# Patient Record
Sex: Male | Born: 1970 | Race: White | Hispanic: No | Marital: Married | Smoking: Never smoker
Health system: Southern US, Community
[De-identification: ages and names within clinical notes are randomized; demographics above are authoritative.]

## PROBLEM LIST (undated history)

## (undated) DIAGNOSIS — E291 Testicular hypofunction: Secondary | ICD-10-CM

## (undated) DIAGNOSIS — I422 Other hypertrophic cardiomyopathy: Secondary | ICD-10-CM

## (undated) DIAGNOSIS — I10 Essential (primary) hypertension: Secondary | ICD-10-CM

## (undated) DIAGNOSIS — E785 Hyperlipidemia, unspecified: Secondary | ICD-10-CM

## (undated) HISTORY — DX: Essential (primary) hypertension: I10

## (undated) HISTORY — PX: APPENDECTOMY: SHX54

## (undated) HISTORY — PX: TONSILLECTOMY: SUR1361

## (undated) HISTORY — DX: Other hypertrophic cardiomyopathy: I42.2

## (undated) HISTORY — DX: Hyperlipidemia, unspecified: E78.5

## (undated) HISTORY — DX: Testicular hypofunction: E29.1

## (undated) HISTORY — PX: HERNIA REPAIR: SHX51

---

## 2005-11-11 ENCOUNTER — Ambulatory Visit: Payer: Self-pay | Admitting: General Surgery

## 2008-01-25 ENCOUNTER — Ambulatory Visit: Payer: Self-pay | Admitting: Cardiology

## 2008-02-11 ENCOUNTER — Ambulatory Visit: Payer: Self-pay | Admitting: Internal Medicine

## 2008-02-11 ENCOUNTER — Encounter: Payer: Self-pay | Admitting: Cardiology

## 2008-02-11 LAB — CONVERTED CEMR LAB
BUN: 14 mg/dL (ref 6–23)
Calcium: 9.8 mg/dL (ref 8.4–10.5)
Glucose, Bld: 59 mg/dL — ABNORMAL LOW (ref 70–99)

## 2008-02-14 ENCOUNTER — Ambulatory Visit (HOSPITAL_COMMUNITY): Admission: RE | Admit: 2008-02-14 | Discharge: 2008-02-14 | Payer: Self-pay | Admitting: Cardiology

## 2008-02-25 ENCOUNTER — Ambulatory Visit: Payer: Self-pay | Admitting: Cardiology

## 2008-02-26 ENCOUNTER — Encounter: Payer: Self-pay | Admitting: Cardiology

## 2008-02-27 ENCOUNTER — Ambulatory Visit: Payer: Self-pay | Admitting: Cardiology

## 2008-12-31 DIAGNOSIS — I429 Cardiomyopathy, unspecified: Secondary | ICD-10-CM

## 2008-12-31 DIAGNOSIS — I10 Essential (primary) hypertension: Secondary | ICD-10-CM | POA: Insufficient documentation

## 2009-03-03 ENCOUNTER — Ambulatory Visit: Payer: Self-pay

## 2009-03-03 ENCOUNTER — Encounter: Payer: Self-pay | Admitting: Cardiology

## 2009-03-12 ENCOUNTER — Ambulatory Visit: Payer: Self-pay | Admitting: Cardiology

## 2009-03-12 DIAGNOSIS — I422 Other hypertrophic cardiomyopathy: Secondary | ICD-10-CM | POA: Insufficient documentation

## 2009-03-12 DIAGNOSIS — E785 Hyperlipidemia, unspecified: Secondary | ICD-10-CM | POA: Insufficient documentation

## 2009-04-01 ENCOUNTER — Telehealth: Payer: Self-pay | Admitting: Cardiology

## 2009-04-14 ENCOUNTER — Ambulatory Visit: Payer: Self-pay | Admitting: Cardiology

## 2009-04-15 LAB — CONVERTED CEMR LAB
ALT: 19 units/L (ref 0–53)
Alkaline Phosphatase: 60 units/L (ref 39–117)
Creatinine, Ser: 0.86 mg/dL (ref 0.40–1.50)
Glucose, Bld: 85 mg/dL (ref 70–99)
LDL Cholesterol: 117 mg/dL — ABNORMAL HIGH (ref 0–99)
Sodium: 139 meq/L (ref 135–145)
Total Bilirubin: 0.7 mg/dL (ref 0.3–1.2)
Total CHOL/HDL Ratio: 4.1
Total Protein: 7.4 g/dL (ref 6.0–8.3)
Triglycerides: 91 mg/dL (ref <150)
VLDL: 18 mg/dL (ref 0–40)

## 2009-05-19 ENCOUNTER — Telehealth: Payer: Self-pay | Admitting: Cardiology

## 2009-08-17 ENCOUNTER — Ambulatory Visit: Payer: Self-pay | Admitting: Family Medicine

## 2009-08-17 DIAGNOSIS — B351 Tinea unguium: Secondary | ICD-10-CM

## 2009-08-17 HISTORY — DX: Tinea unguium: B35.1

## 2009-08-17 LAB — CONVERTED CEMR LAB
ALT: 22 units/L (ref 0–53)
AST: 43 units/L — ABNORMAL HIGH (ref 0–37)
Albumin: 4.5 g/dL (ref 3.5–5.2)
Alkaline Phosphatase: 67 units/L (ref 39–117)
Bilirubin, Direct: 0.2 mg/dL (ref 0.0–0.3)
CO2: 27 meq/L (ref 19–32)
Calcium: 9.6 mg/dL (ref 8.4–10.5)
Chloride: 107 meq/L (ref 96–112)
Glucose, Bld: 90 mg/dL (ref 70–99)
HDL: 39.8 mg/dL (ref >39.00)
Sodium: 143 meq/L (ref 135–145)
Total Protein: 7.3 g/dL (ref 6.0–8.3)

## 2009-08-26 ENCOUNTER — Telehealth: Payer: Self-pay | Admitting: Family Medicine

## 2009-10-05 ENCOUNTER — Ambulatory Visit: Payer: Self-pay | Admitting: Family Medicine

## 2009-10-05 LAB — CONVERTED CEMR LAB
Albumin: 4.4 g/dL (ref 3.5–5.2)
Total Protein: 7.2 g/dL (ref 6.0–8.3)

## 2009-11-20 ENCOUNTER — Ambulatory Visit: Payer: Self-pay | Admitting: Family Medicine

## 2009-11-23 LAB — CONVERTED CEMR LAB
AST: 55 units/L — ABNORMAL HIGH (ref 0–37)
Alkaline Phosphatase: 62 units/L (ref 39–117)
Bilirubin, Direct: 0.1 mg/dL (ref 0.0–0.3)
Total Bilirubin: 0.7 mg/dL (ref 0.3–1.2)

## 2009-12-23 ENCOUNTER — Telehealth: Payer: Self-pay | Admitting: Family Medicine

## 2010-01-04 ENCOUNTER — Ambulatory Visit: Payer: Self-pay | Admitting: Family Medicine

## 2010-01-04 LAB — CONVERTED CEMR LAB
ALT: 26 units/L (ref 0–53)
AST: 45 units/L — ABNORMAL HIGH (ref 0–37)
Albumin: 4.2 g/dL (ref 3.5–5.2)
Alkaline Phosphatase: 62 units/L (ref 39–117)

## 2010-02-22 ENCOUNTER — Encounter: Payer: Self-pay | Admitting: Cardiology

## 2010-02-22 ENCOUNTER — Ambulatory Visit: Payer: Self-pay | Admitting: Cardiology

## 2010-04-13 NOTE — Progress Notes (Signed)
Summary: refill request for grifulvin  Phone Note Refill Request Message from:  Fax from Pharmacy  Refills Requested: Medication #1:  GRIFULVIN V 500 MG TABS 1 tab by mouth daily x 6 weeks. Dispense qs.   Last Refilled: 11/21/2009 Faxed request from [facility] [address], [phone]  Initial call taken by: Lowella Petties CMA,  December 23, 2009 3:30 PM    Prescriptions: GRIFULVIN V 500 MG TABS (GRISEOFULVIN MICROSIZE) 1 tab by mouth daily x 6 weeks. Dispense qs  #1 x 0   Entered and Authorized by:   Ruthe Mannan MD   Signed by:   Ruthe Mannan MD on 12/24/2009   Method used:   Electronically to        [facility] [address] (retail)       [address]       [place], [place]  [address]       Ph: [phone]       Fax: [phone]   RxID:   [id number]

## 2010-04-13 NOTE — Assessment & Plan Note (Signed)
Summary: FUNGUS IN NAIL / LFW   Vital Signs:  Patient profile:   40 year old male Height:      71.50 inches Weight:      216 pounds BMI:     29.81 Temp:     98.3 degrees F oral Pulse rate:   60 / minute Pulse rhythm:   regular BP sitting:   104 / 72  (left arm) Cuff size:   regular  Vitals Entered By: Linde Gillis CMA Duncan Dull) (November 20, 2009 11:56 AM) CC: nail fungus   History of Present Illness: 40 yo here for follow up of nail fungust.  White flakes on finger nails- has been there for months, now nails look like they are growing differently as well. Has tried OTC lamisil with no relief of symptoms.  We started Terbinafine 250 mg 1 tab by mouth daily in June.  Feels his nails are better, but still has two finger nails that look the same.  Denies any known side effects or adverse reactions to the terbinafine.  no abdominal pain, nausea, vomiting or changes in his stools.  LFTs have been stable.    Current Medications (verified): 1)  Ramipril 2.5 Mg Caps (Ramipril) .... Take One Capsule By Mouth Daily 2)  Metoprolol Succinate 50 Mg Xr24h-Tab (Metoprolol Succinate) .... Take One Tablet By Mouth Daily 3)  Terbinafine Hcl 250 Mg Tabs (Terbinafine Hcl) .Marland Kitchen.. 1 Tab Po Daily For Up To 6 Weeks 4)  Itraconazole 100 Mg Caps (Itraconazole) .... 2 Tab By Mouth Daily X 12 Weeks. Dispense Qs  Allergies (verified): No Known Drug Allergies  Past History:  Past Medical History: Last updated: 08/17/2009 1. Stage I hypertension. 2. Borderline hypogonadism.   3. History of elevated triglycerides.  The patient was on Lovaza on the past for this; however, he no longer takes it, and his most recent triglycerides were excellent at 87. 4. Hypertrophic cardiomyopathy.  Cardiac MRI was done December 2009, showing EF of 66%.  There is focal basal septal hypertrophy.  The maximum thickness at the basal septum is 2.2 cm.  There is no systolic anterior motion of the mitral valve, so an LV outflow  tract gradient is unlikely.  There was no myocardial delayed enhancement.  Holter monitor was also done in November 2009 and was essentially normal with no PVCs or runs of NSVT.  ETT showed good exercise tolerance and normal blood pressure response.  Echo (12/10) showed severe focal basal septal asymmetric hypertrophy.  No SAM or LV outflow tract gradient.  EF 55-60%.  Mild MR.  Parents and children have been screened for HCM by echo with no suggestive findings.     Past Surgical History: Last updated: 12/31/2008 Appendectomy Tonsillectomy hernia   Family History: Last updated: 03/12/2009 The patient states that his grandmother died at age 55 from congestive heart failure and diabetes.  His father has hypertension.  He had 1 grandfather who had a heart attack before the age of 23.  His other grandfather had what sounds like an episode of sudden cardiac death before the age of 39.   Social History: Last updated: 03/12/2009 The patient is married.  He has 2 children.  He is originally from [place].  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  He does not smoke or use any illicit drugs.   Risk Factors: Exercise: yes (12/31/2008)  Risk Factors: Smoking Status: never (12/31/2008)  Review of Systems  See HPI General:  Denies malaise. GI:  Denies abdominal pain, bloody stools, and change in bowel habits.  Physical Exam  General:  Well developed, well nourished, in no acute distress. Extremities:  No clubbing, cyanosis, edema, or deformity noted with normal full range of motion of all joints.   Does have nail changes consistent with fungal infection but improved. Psych:  Normal affect.   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 Will stop Terbinafine and Try Itraconazole.  Recheck LFTs again today. Follow up in 6 weeks. The following medications were  removed from the medication list:    Terbinafine Hcl 250 Mg Tabs (Terbinafine hcl) .Marland Kitchen... 1 tab po daily for up to 6 weeks His updated medication list for this problem includes:    Itraconazole 100 Mg Caps (Itraconazole) .Marland Kitchen... 2 tab by mouth daily x 12 weeks. dispense qs  Orders: Venipuncture (62130) TLB-Hepatic/Liver Function Pnl (80076-HEPATIC)  Complete Medication List: 1)  Ramipril 2.5 Mg Caps (Ramipril) .... Take one capsule by mouth daily 2)  Metoprolol Succinate 50 Mg Xr24h-tab (Metoprolol succinate) .... Take one tablet by mouth daily 3)  Itraconazole 100 Mg Caps (Itraconazole) .... 2 tab by mouth daily x 12 weeks. dispense qs Prescriptions: ITRACONAZOLE 100 MG CAPS (ITRACONAZOLE) 2 tab by mouth daily x 12 weeks. dispense qs  #1 x 0   Entered and Authorized by:   Ruthe Mannan MD   Signed by:   Ruthe Mannan MD on 11/20/2009   Method used:   Electronically to        [facility] [address] (retail)       [address]       [place], [place]  [address]       Ph: [phone]       Fax: [phone]   RxID:   [id number]   Current Allergies (reviewed today): No known allergies   Appended Document: FUNGUS IN NAIL / LFW Rx for Itrancozole cancelled at [facility].  Dr. Dayton Martes sent in correct Rx of Grifulvin electronically.

## 2010-04-13 NOTE — Letter (Signed)
Summary: Records Dated 10-07-05 thru 09-05-08/[facility]  Records Dated 10-07-05 thru 09-05-08/[facility]   Imported By: Lanelle Bal 08/24/2009 08:47:55  _____________________________________________________________________  External Attachment:    Type:   Image     Comment:   External Document

## 2010-04-13 NOTE — Assessment & Plan Note (Signed)
Summary: 6 week follow up per Dr. Aron/nt   Vital Signs:  Patient profile:   40 year old male Height:      71.50 inches Weight:      215.13 pounds BMI:     29.69 Temp:     97.8 degrees F oral Pulse rate:   60 / minute Pulse rhythm:   regular BP sitting:   130 / 80  (left arm) Cuff size:   regular  Vitals Entered By: Linde Gillis CMA Duncan Dull) (October 05, 2009 8:09 AM) CC: blood test to check liver function   History of Present Illness: 40 yo here for six week follow up.    White flakes on finger nails- has been there for months, now nails look like they are growing differently as well. Has tried OTC lamisil with no relief of symptoms.  We started Terbinafine 250 mg 1 tab by mouth daily 6 weeks ago.  Feels his nails are better, but still has a few flakes.    Denies any known side effects or adverse reactions to the terbinafine.  no abdominal pain, nausea, vomiting or changes in his stools.    Current Medications (verified): 1)  Ramipril 2.5 Mg Caps (Ramipril) .... Take One Capsule By Mouth Daily 2)  Metoprolol Succinate 50 Mg Xr24h-Tab (Metoprolol Succinate) .... Take One Tablet By Mouth Daily 3)  Terbinafine Hcl 250 Mg Tabs (Terbinafine Hcl) .Marland Kitchen.. 1 Tab Po Daily For Up To 6 Weeks  Allergies (verified): No Known Drug Allergies  Past History:  Past Medical History: Last updated: 08/17/2009 1. Stage I hypertension. 2. Borderline hypogonadism.   3. History of elevated triglycerides.  The patient was on Lovaza on the past for this; however, he no longer takes it, and his most recent triglycerides were excellent at 87. 4. Hypertrophic cardiomyopathy.  Cardiac MRI was done December 2009, showing EF of 66%.  There is focal basal septal hypertrophy.  The maximum thickness at the basal septum is 2.2 cm.  There is no systolic anterior motion of the mitral valve, so an LV outflow tract gradient is unlikely.  There was no myocardial delayed enhancement.  Holter monitor was also done in  November 2009 and was essentially normal with no PVCs or runs of NSVT.  ETT showed good exercise tolerance and normal blood pressure response.  Echo (12/10) showed severe focal basal septal asymmetric hypertrophy.  No SAM or LV outflow tract gradient.  EF 55-60%.  Mild MR.  Parents and children have been screened for HCM by echo with no suggestive findings.     Past Surgical History: Last updated: 12/31/2008 Appendectomy Tonsillectomy hernia   Family History: Last updated: 03/12/2009 The patient states that his grandmother died at age 39 from congestive heart failure and diabetes.  His father has hypertension.  He had 1 grandfather who had a heart attack before the age of 82.  His other grandfather had what sounds like an episode of sudden cardiac death before the age of 19.   Social History: Last updated: 03/12/2009 The patient is married.  He has 2 children.  He is originally from [place].  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  He does not smoke or use any illicit drugs.   Risk Factors: Exercise: yes (12/31/2008)  Risk Factors: Smoking Status: never (12/31/2008)  Review of Systems      See HPI General:  Denies malaise. GI:  Denies abdominal pain, change in bowel habits, nausea, vomiting,  and yellowish skin color.  Physical Exam  General:  Well developed, well nourished, in no acute distress. Extremities:  No clubbing, cyanosis, edema, or deformity noted with normal full range of motion of all joints.   Does have nail changes consistent with fungal infection but improved. Psych:  Normal affect.   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 Follow up again in 6 weeks. His updated medication list for this problem includes:    Terbinafine Hcl 250 Mg Tabs (Terbinafine hcl) .Marland Kitchen... 1 tab po daily for  up to 6 weeks  Orders: TLB-Hepatic/Liver Function Pnl (80076-HEPATIC) Venipuncture (08657)  Complete Medication List: 1)  Ramipril 2.5 Mg Caps (Ramipril) .... Take one capsule by mouth daily 2)  Metoprolol Succinate 50 Mg Xr24h-tab (Metoprolol succinate) .... Take one tablet by mouth daily 3)  Terbinafine Hcl 250 Mg Tabs (Terbinafine hcl) .Marland Kitchen.. 1 tab po daily for up to 6 weeks  Current Allergies (reviewed today): No known allergies

## 2010-04-13 NOTE — Assessment & Plan Note (Signed)
Summary: NEW PT TO EST/CPX/CLE   Vital Signs:  Patient profile:   40 year old male Height:      71.50 inches Weight:      217.38 pounds BMI:     30.00 Temp:     98.2 degrees F oral Pulse rate:   48 / minute Pulse rhythm:   regular BP sitting:   116 / 80  (right arm) Cuff size:   regular  Vitals Entered By: Linde Gillis CMA Duncan Dull) (August 17, 2009 9:42 AM) CC: new patient   History of Present Illness: 40 yo here to establish care.  Sees Dr. Shirlee Latch for hypertrophic cardiomyopathy- doing well.  Has no CP at rest or exertion.  Never had any dizziness or episodes of syncope.   At recent office visit, Metoprolol was increased to 50 mg daily.  He is doing well with new dose.  HTN- has always had mildly elevated bp, particularly diastolic.  Taking Ramipirl 2.5 mg in addition to his metoprolol.  Has a strong FH of HTN and CAD.  White flakes on finger nails- has been there for months, now nails look like they are growing differently as well. Has tried OTC lamisil with no relief of symptoms.  Well man- he and his wife do not believe in immunizations for themselves or their children.   No religious beliefs that will interfere with medical care.  Current Medications (verified): 1)  Ramipril 2.5 Mg Caps (Ramipril) .... Take One Capsule By Mouth Daily 2)  Metoprolol Succinate 50 Mg Xr24h-Tab (Metoprolol Succinate) .... Take One Tablet By Mouth Daily  Allergies (verified): No Known Drug Allergies  Past History:  Past Surgical History: Last updated: 12/31/2008 Appendectomy Tonsillectomy hernia   Family History: Last updated: 03/12/2009 The patient states that his grandmother died at age 65 from congestive heart failure and diabetes.  His father has hypertension.  He had 1 grandfather who had a heart attack before the age of 42.  His other grandfather had what sounds like an episode of sudden cardiac death before the age of 40.   Social History: Last updated: 03/12/2009 The patient  is married.  He has 2 children.  He is originally from [place].  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  He does not smoke or use any illicit drugs.   Risk Factors: Exercise: yes (12/31/2008)  Risk Factors: Smoking Status: never (12/31/2008)  Past Medical History: 1. Stage I hypertension. 2. Borderline hypogonadism.   3. History of elevated triglycerides.  The patient was on Lovaza on the past for this; however, he no longer takes it, and his most recent triglycerides were excellent at 87. 4. Hypertrophic cardiomyopathy.  Cardiac MRI was done December 2009, showing EF of 66%.  There is focal basal septal hypertrophy.  The maximum thickness at the basal septum is 2.2 cm.  There is no systolic anterior motion of the mitral valve, so an LV outflow tract gradient is unlikely.  There was no myocardial delayed enhancement.  Holter monitor was also done in November 2009 and was essentially normal with no PVCs or runs of NSVT.  ETT showed good exercise tolerance and normal blood pressure response.  Echo (12/10) showed severe focal basal septal asymmetric hypertrophy.  No SAM or LV outflow tract gradient.  EF 55-60%.  Mild MR.  Parents and children have been screened for HCM by echo with no suggestive findings.     Review of Systems  See HPI General:  Denies fever and malaise. Eyes:  Denies blurring. ENT:  Denies difficulty swallowing. CV:  Denies chest pain or discomfort. Resp:  Denies shortness of breath. GI:  Denies abdominal pain, bloody stools, and change in bowel habits. GU:  Denies decreased libido and erectile dysfunction. MS:  Denies joint pain, joint redness, and joint swelling. Derm:  Denies rash. Neuro:  Denies headaches. Psych:  Denies anxiety and depression. Endo:  Denies cold intolerance and heat intolerance. Heme:  Denies abnormal bruising and bleeding.  Physical Exam  General:  Well developed, well nourished, in no acute  distress. Head:  normocephalic and atraumatic.   Eyes:  vision grossly intact and pupils equal.   Ears:  R ear normal and L ear normal.   Nose:  no external deformity.   Mouth:  Oral mucosa and oropharynx without lesions or exudates.  Teeth in good repair. Lungs:  Clear bilaterally to auscultation and percussion. Heart:  Non-displaced PMI, chest non-tender; regular rate and rhythm, S1, S2 without murmurs, rubs or gallops. Carotid upstroke normal, no bruit.  Pedals normal pulses. No edema, no varicosities. Abdomen:  Bowel sounds positive,abdomen soft and non-tender without masses, organomegaly or hernias noted. Extremities:  No clubbing, cyanosis, edema, or deformity noted with normal full range of motion of all joints.   Does have nail changes consistent with fungal infection. Skin:  turgor normal and color normal.   Psych:  Normal affect.   Impression & Recommendations:  Problem # 1:  CARDIOMYOPATHY, HYPERTROPHIC (ICD-425.1) Assessment Unchanged Followed by Dr. Shirlee Latch.  Doing well.    Problem # 2:  HYPERLIPIDEMIA-MIXED (ICD-272.4) Assessment: Unchanged Has not had lipid panel checked in several months.  Will recheck today. 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 Check LFTs, if normal, start oral meds.  Problem # 4:  HYPERTENSION, UNSPECIFIED (ICD-401.9) Assessment: Unchanged Stable.  Continue current meds. His updated medication list for this problem includes:    Ramipril 2.5 Mg Caps (Ramipril) .Marland Kitchen... Take one capsule by mouth daily    Metoprolol Succinate 50 Mg Xr24h-tab (Metoprolol succinate) .Marland Kitchen... Take one tablet by mouth daily  Orders: Venipuncture (78295) TLB-BMP (Basic Metabolic Panel-BMET) (80048-METABOL)  Complete Medication List: 1)  Ramipril 2.5 Mg Caps (Ramipril) .... Take one capsule by mouth daily 2)  Metoprolol  Succinate 50 Mg Xr24h-tab (Metoprolol succinate) .... Take one tablet by mouth daily  Patient Instructions: 1)  Nice to meet you, Tony Murphy. 2)  I will be in touch with your lab work in the next couple of days.  Current Allergies (reviewed today): No known allergies

## 2010-04-13 NOTE — Progress Notes (Signed)
Summary: RX   Phone Note Refill Request Call back at Home Phone [phone] Message from:  Patient on May 19, 2009 8:12 AM  Refills Requested: Medication #1:  RAMIPRIL 2.5 MG CAPS Take one capsule by mouth daily [facility]  Initial call taken by: Harlon Flor,  May 19, 2009 8:12 AM    Prescriptions: RAMIPRIL 2.5 MG CAPS (RAMIPRIL) Take one capsule by mouth daily  #90 x 3   Entered by:   Mercer Pod   Authorized by:   Marca Ancona, MD   Signed by:   Mercer Pod on 05/19/2009   Method used:   Electronically to        [facility] (mail-order)             ,          Ph: [phone]       Fax: [phone]   RxID:   [id number]

## 2010-04-13 NOTE — Progress Notes (Signed)
Summary: wants medicine for nail fungus  Phone Note Call from Patient Call back at [phone]   Caller: Patient Call For: Ruthe Mannan MD Summary of Call: Pt states that when he was here for his first visit he had talked with you about getting an oral medicine for his nail fungus.  I advised him that unless he has a positive nail  culture his insurance probably wont cover it, but that he can get generic terbenifine at [facility] for $4, so he will go with that.  Will use [facility] [address].  Please let him know if medicine is called in. Initial call taken by: Lowella Petties CMA,  August 26, 2009 11:34 AM  Follow-up for Phone Call        Terbinafine sent in. Please have pt come in to see me in 6 weeks to recheck LFTs and reassess his fungus. Ruthe Mannan MD  August 26, 2009 11:44 AM  Patient advised as instructed via telephone, Rx sent to pharmacy and 6 week f/u appt made for 10/05/2009 at 8:15.  Follow-up by: Linde Gillis CMA Duncan Dull),  August 26, 2009 11:51 AM    New/Updated Medications: TERBINAFINE HCL 250 MG TABS (TERBINAFINE HCL) 1 tab po daily for up to 6 weeks Prescriptions: TERBINAFINE HCL 250 MG TABS (TERBINAFINE HCL) 1 tab po daily for up to 6 weeks  #60 x 1   Entered and Authorized by:   Ruthe Mannan MD   Signed by:   Ruthe Mannan MD on 08/26/2009   Method used:   Electronically to        [facility] [address] (retail)       [address]       [place], [place]  [address]       Ph: [phone]       Fax: [phone]   RxID:   [id number]

## 2010-04-13 NOTE — Progress Notes (Signed)
Summary: bp check   Phone Note Outgoing Call   Summary of Call: BP check:[phone]/[id number]

## 2010-04-15 NOTE — Assessment & Plan Note (Signed)
Summary: F1Y/AMD  Medications Added RAMIPRIL 2.5 MG CAPS (RAMIPRIL) Take one capsule by mouth daily      Allergies Added: NKDA  Visit Type:  Follow-up Primary Provider:  Dr. Dayton Martes  CC:  "doing well". denies chest pain and SOB.  History of Present Illness: 40 yo with history of hypertrophic cardiomyopathy presents for followup.  He has been doing well over the last year.  He exercises regularly with no exercise intolerance, lightheadedness, or chest pain.  No episodes of syncope.  His children, aged 7 and 57, have been screened by echo, as have his parents.  No one else has an abnormal echocardiogram.  His most recent echo shows stable significant asymmetric hypertrophy of the basal septum.  No LVOT gradient or systolic anterior motion of the mitral valve.  BP is good today.   ECG: NSR at 46, otherwise normal  Labs (12/09): lipoprotein(a) = 5 Labs (6/11): LDL 127, HDL 40  Current Medications (verified): 1)  Ramipril 2.5 Mg Caps (Ramipril) .... Take One Capsule By Mouth Daily 2)  Metoprolol Succinate 50 Mg Xr24h-Tab (Metoprolol Succinate) .... Take One Tablet By Mouth Daily  Allergies (verified): No Known Drug Allergies  Past History:  Past Surgical History: Last updated: 12/31/2008 Appendectomy Tonsillectomy hernia   Family History: Last updated: 02/22/2010 The patient states that his grandmother died at age 8 from congestive heart failure and diabetes.  His father has hypertension.  He had 1 grandfather who had a heart attack before the age of 72.  His other grandfather had what sounds like an episode of sudden cardiac death before the age of 76.  Mother with atrial fibrillation.   Social History: Last updated: 03/12/2009 The patient is married.  He has 2 children.  He is originally from [place].  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  He does not smoke or use any illicit drugs.   Risk Factors: Exercise: yes  (12/31/2008)  Risk Factors: Smoking Status: never (12/31/2008)  Past Medical History: Reviewed history from 08/17/2009 and no changes required. 1. Stage I hypertension. 2. Borderline hypogonadism.   3. History of elevated triglycerides.  The patient was on Lovaza on the past for this; however, he no longer takes it, and his most recent triglycerides were excellent at 87. 4. Hypertrophic cardiomyopathy.  Cardiac MRI was done December 2009, showing EF of 66%.  There is focal basal septal hypertrophy.  The maximum thickness at the basal septum is 2.2 cm.  There is no systolic anterior motion of the mitral valve, so an LV outflow tract gradient is unlikely.  There was no myocardial delayed enhancement.  Holter monitor was also done in November 2009 and was essentially normal with no PVCs or runs of NSVT.  ETT showed good exercise tolerance and normal blood pressure response.  Echo (12/10) showed severe focal basal septal asymmetric hypertrophy.  No SAM or LV outflow tract gradient.  EF 55-60%.  Mild MR.  Parents and children have been screened for HCM by echo with no suggestive findings.     Family History: Reviewed history from 03/12/2009 and no changes required. The patient states that his grandmother died at age 45 from congestive heart failure and diabetes.  His father has hypertension.  He had 1 grandfather who had a heart attack before the age of 24.  His other grandfather had what sounds like an episode of sudden cardiac death before the age of 34.  Mother with atrial fibrillation.  Social History: Reviewed history from 03/12/2009 and no changes required. The patient is married.  He has 2 children.  He is originally from [place].  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  He does not smoke or use any illicit drugs.   Vital Signs:  Patient profile:   40 year old male Height:      71.50 inches Weight:      226.38 pounds BMI:     31.25 Pulse rate:    46 / minute BP sitting:   126 / 72  (left arm) Cuff size:   regular  Vitals Entered By: Lysbeth Galas CMA (February 22, 2010 3:20 PM)  Physical Exam  General:  Well developed, well nourished, in no acute distress. Neck:  Neck supple, no JVD. No masses, thyromegaly or abnormal cervical nodes. Lungs:  Clear bilaterally to auscultation and percussion. Heart:  Non-displaced PMI, chest non-tender; regular rate and rhythm, S1, S2 without murmurs, rubs or gallops. Carotid upstroke normal, no bruit.  Pedals normal pulses. No edema, no varicosities. Abdomen:  Bowel sounds positive; abdomen soft and non-tender without masses, organomegaly, or hernias noted. No hepatosplenomegaly. Extremities:  No clubbing or cyanosis. Neurologic:  Alert and oriented x 3. Psych:  Normal affect.   Impression & Recommendations:  Problem # 1:  CARDIOMYOPATHY, HYPERTROPHIC (ICD-425.1) Significant asymmetric basal septal hypertrophy by echo.  No LVOT gradient or systolic anterior motion of the mitral valve.  No delayed enhancement on cardiac MRI, no NSVT or frequent PVCs on holter, normal ETT.  Overall he appears to have a low risk variant of HCM.  Continue Toprol XL.  Should repeat echo in 2 years unless symptoms develop.  His children should be re-echoed in high school or when they start competitive sports.   Problem # 2:  HYPERTENSION, UNSPECIFIED (ICD-401.9) BP at goal.   Problem # 3:  HYPERLIPIDEMIA-MIXED (ICD-272.4) LDL a little higher than I would like given family history of CAD.  I encouraged him to watch his diet and exercise more.   Patient Instructions: 1)  Your physician recommends that you schedule a follow-up appointment in: 1 year 2)  Your physician recommends that you continue on your current medications as directed. Please refer to the Current Medication list given to you today. Prescriptions: METOPROLOL SUCCINATE 50 MG XR24H-TAB (METOPROLOL SUCCINATE) Take one tablet by mouth daily  #90 x 3    Entered by:   Lanny Hurst RN   Authorized by:   Marca Ancona, MD   Signed by:   Lanny Hurst RN on 02/22/2010   Method used:   Electronically to        [facility]* (retail)             ,          Ph: [phone]       Fax: [phone]   RxID:   [id number] RAMIPRIL 2.5 MG CAPS (RAMIPRIL) Take one capsule by mouth daily  #90 x 3   Entered by:   Lanny Hurst RN   Authorized by:   Marca Ancona, MD   Signed by:   Lanny Hurst RN on 02/22/2010   Method used:   Electronically to        [facility]* (retail)             ,          Ph: [phone]       Fax: [phone]   RxID:  [id number]  

## 2010-07-26 ENCOUNTER — Encounter: Payer: Self-pay | Admitting: Family Medicine

## 2010-07-26 ENCOUNTER — Ambulatory Visit (INDEPENDENT_AMBULATORY_CARE_PROVIDER_SITE_OTHER): Payer: 59 | Admitting: Family Medicine

## 2010-07-26 DIAGNOSIS — R059 Cough, unspecified: Secondary | ICD-10-CM

## 2010-07-26 DIAGNOSIS — R05 Cough: Secondary | ICD-10-CM

## 2010-07-26 MED ORDER — BENZONATATE 100 MG PO CAPS: 100.0000 mg | ORAL_CAPSULE | Freq: Four times a day (QID) | ORAL | Status: DC | PRN

## 2010-07-26 NOTE — Patient Instructions (Signed)
Please hold your Ramipril. Come in two weeks for a nurse visit, blood pressure check.

## 2010-07-26 NOTE — Progress Notes (Signed)
Subjective:     Tony Murphy is a 40 y.o. male here for evaluation of a cough. Onset of symptoms was 4 weeks ago. Symptoms have been changing since that time.  When symptoms started, had a runny nose, productive cough, congestion.  Now has a dry, lingering cough.  The cough is barky and dry and is aggravated by reclining position. Associated symptoms include: none. Patient does not have a history of asthma. Patient does not have a history of environmental allergens. Patient has not traveled recently. Patient does not have a history of smoking. Patient has not had a previous chest x-ray. Patient has not had a PPD done.  Has been on Ramipril for years, never had issues with a cough in past. Of note, family does not receive routine vaccinations.  The PMH, PSH, Social History, Family History, Medications, and allergies have been reviewed in [facility], and have been updated if relevant.   Review of Systems Pertinent items are noted in HPI.    Objective:   BP 120/80  Pulse 40  Temp(Src) 98.3 F (36.8 C) (Oral)  Ht 5\' 11"  (1.803 m)  Wt 216 lb 1.9 oz (98.031 kg)  BMI 30.14 kg/m2  BP 120/80  Pulse 40  Temp(Src) 98.3 F (36.8 C) (Oral)  Ht 5\' 11"  (1.803 m)  Wt 216 lb 1.9 oz (98.031 kg)  BMI 30.14 kg/m2  General Appearance:    Alert, cooperative, no distress, appears stated age  Head:    Normocephalic, without obvious abnormality, atraumatic  Eyes:    PERRL, conjunctiva/corneas clear, EOM's intact, fundi    benign, both eyes       Ears:    Normal TM's and external ear canals, both ears  Nose:   Nares normal, septum midline, mucosa normal, no drainage    or sinus tenderness  Throat:   Lips, mucosa, and tongue normal; teeth and gums normal  Neck:   Supple, symmetrical, trachea midline, no adenopathy;       thyroid:  No enlargement/tenderness/nodules; no carotid   bruit or JVD  Back:     Symmetric, no curvature, ROM normal, no CVA tenderness  Lungs:     Clear to auscultation bilaterally,  respirations unlabored  Chest wall:    No tenderness or deformity  Heart:    Regular rate and rhythm, S1 and S2 normal, no murmur, rub   or gallop  Extremities:   Extremities normal, atraumatic, no cyanosis or edema  Pulses:   2+ and symmetric all extremities  Skin:   Skin color, texture, turgor normal, no rashes or lesions            Assessment:    URI with Post Nasal Drip    Plan:  Tessalon perles as needed for cough. Advised to STOP taking Ramipril for now, come back in 2 weeks for BP check. Red flags given requiring immediate follow up. The patient indicates understanding of these issues and agrees with the plan.

## 2010-07-27 NOTE — Assessment & Plan Note (Signed)
[facility] OFFICE NOTE   NARAYAN, SCULL                        MRN:          [id number]  DATE:02/25/2008                            DOB:          04-11-1970    PRIMARY CARE PHYSICIAN:  Sherryll Burger, PAC at [facility], [employer].   HISTORY OF PRESENT ILLNESS:  This is a 40 year old with history of mild  hypertension who presented initially to Cardiology Clinic for evaluation  of an abnormal echocardiogram.  He does report a 2-year history of  having borderline hypertension.  He says the highest blood pressure  that has ever been recorded for him has been 140/100.  I reviewed his  primary care physician's notes.  His systolic blood pressures run  primarily in the 110s.  The patient is on ramipril 2.5 mg daily.  The  patient continues in general to be quite active with no cardiovascular-  type complaints.  He runs 4-5 times a week.  He runs up to 6 miles at a  time, does not get lightheaded or short of breath and does not develop  chest pain.  He does get occasional palpitations.  The echocardiogram  from [facility] was noted to show severe LVH with some  asymmetry of the septal wall.  There was no systolic anterior motion of  the mitral valve.  No mitral regurgitation.  There did not appear to be  an LV outflow tract gradient.  I was not actually able to review his  echo.  I have only the report to go by.  Based on this, I was concerned  that he could have hypertrophic cardiomyopathy.  We did a Holter monitor  given his history of palpitations.  This was normal.  There were no  significant PVCs or runs of NSVT.  We did a cardiac MRI to evaluate for  hypertrophic cardiomyopathy and this showed EF of 66% and focal basal  septal hypertrophy with a maximum thickness at the basal septum of 2.2  cm.  There was no systolic anterior motion of the mitral valve.  There  was no LV  delayed enhancement.   PAST MEDICAL HISTORY:  1. Stage I hypertension.  2. Borderline hypogonadism.  The patient is on AndroGel for this.  3. History of elevated triglycerides.  The patient was on Lovaza on      the past for this; however, he no longer takes it, and his most      recent triglycerides were excellent at 87.  4. Hypertrophic cardiomyopathy.  Echo report from October 2009 at      [facility] was obtained.  Aortic root measured 4.0      cm.  There was normal LV systolic function with EF of 57%.  There      was severe left ventricular hypertrophy with some asymmetry of the      septal wall which measured 2.4 cm.  There was no systolic anterior      motion of the mitral valve.  There was mild mitral regurgitation.  There was no aortic stenosis or pulmonary hypertension.  The actual      study images were not available for my review and this report is      from the written report.  Cardiac MRI was done December 2009,      showing EF of 66%.  There is focal basal septal hypertrophy.  The      maximum thickness at the basal septum is 2.2 cm.  There is no      systolic anterior motion of the mitral valve, so an LV outflow      tract gradient is unlikely.  There was no myocardial delayed      enhancement.  Holter monitor was also done in November 2009 and was      essentially normal with no PVCs or runs of NSVT.   FAMILY HISTORY:  The patient states that his grandmother died at age 22  from congestive heart failure and diabetes.  His father has  hypertension.  He had 1 grandfather who had a heart attack before the  age of 58.  His other grandfather had what sounds like an episode of  sudden cardiac death before the age of 61.   SOCIAL HISTORY:  The patient is married.  He has 2 children.  He is  originally from [place].  He was here working as Therapist, occupational.  His wife is a Land.  He does not smoke or use any  illicit drugs.    LABORATORY DATA:  From September 2009, triglycerides 87, HDL 42, LDL 84.  LFTs normal, creatinine 0.98.   MEDICATIONS:  1. Altace 2.5 mg daily.  2. AndroGel topically.   PHYSICAL EXAMINATION:  VITAL SIGNS:  Blood pressure is 116/82, pulse is  56 and regular.  GENERAL:  This is a well-developed male in no apparent distress.  NEUROLOGIC:  Alert and oriented x3.  Normal affect.  NECK:  There is no JVD.  There is no thyromegaly or thyroid nodule.  ABDOMEN:  Soft, nontender, no hepatosplenomegaly.  Normal bowel sounds.  CARDIOVASCULAR:  Heart regular rate.  S1 and S2.  No S3, no S4.  There  is no murmur.  I had the patient perform a Valsalva maneuver and this  did not bring out a murmur, and I did have the patient stand up and this  did not bring out a murmur either.  EXTREMITIES:  There is no peripheral edema.  There are 2+ posterior  tibial pulses bilaterally.  No clubbing or cyanosis.  LUNGS:  Clear to auscultation bilaterally with normal respiratory  effort.   ASSESSMENT AND PLAN:  This is a 40 year old with history of stage I  hypertension who has been evaluated here and appears to have  hypertrophic cardiomyopathy.  1. Hypertrophic cardiomyopathy.  The patient does have focal basal      septal hypertrophy with maximal thickness of the basal septal wall      2.2 cm.  The remainder of the LV wall appears to be normal in      thickness.  The patient does not have systolic anterior motion of      the mitral valve on his MRI.  He does not have a murmur.  I do not      believe that an LV outflow tract gradient is likely.  Of note, he      did have a Holter monitor showing no ventricular tachycardia or      PVCs and he did not  have any delayed enhancement on his MRI.  I      think that the patient's mild hypertension is unlikely to have led      to this quite significant asymmetric hypertrophy.  Therefore, I      think hypertrophic cardiomyopathy is the most likely diagnosis.      Based  on the current findings, he is completely asymptomatic, does      not appear to have an outflow tract gradient, does not have delayed      enhancement on his MRI, and has no evidence for PVCs and      nonsustained VT.  So, I think he probably is at a lower risk level      regarding his hypertrophic cardiomyopathy.  I do think that it      would still be reasonable to have him on a beta-blocker.  So, I am      going to start him on Toprol-XL 25 mg daily.  Before we do start      the Toprol-XL, however, I am going to have him do an exercise      treadmill test to make sure that his blood pressure augments      appropriately with exercise.  After that is done, he will start      Toprol-XL 25 mg daily.  Finally, I also talked about having his      brother and his parents get echocardiograms and also his son and      his daughter.  We will try to set his children up to get pediatric      echoes in [place] to see if they have any evidence for      hypertrophic cardiomyopathy.  2. Hypertension.  The patient's blood pressure is under excellent      control and he is on a very low dose of Altace.  His blood pressure      is 116/82 today.  3. The patient's lipids do appear at goal with an LDL less than 100.      His LDL is 88; however, given his fairly strong family history of      what sounds like premature coronary artery disease, I will check a      lipoprotein (a). If that is elevated, I will consider having him on      a statin to get his LDL even lower.  If that is within normal      limits, I will just have him continue to do what he has been doing      with diet and exercise.  4. If the patient's exercise treadmill test looks okay, I will have      him followup with me in a year and we will probably repeat his      echocardiogram at that time.     Marca Ancona, MD  Electronically Signed    DM/MedQ  DD: 02/25/2008  DT: 02/26/2008  Job #: [id number]   cc:   PAC Sherryll Burger   MD Gelene Mink

## 2010-07-27 NOTE — Assessment & Plan Note (Signed)
[facility] OFFICE NOTE   JARNELL, CORDARO                        MRN:          [id number]  DATE:01/25/2008                            DOB:          02/11/1971    PRIMARY CARE PHYSICIAN:  Sherryll Burger, PAC at [facility] in [place].   HISTORY OF PRESENT ILLNESS:  This is a 40 year old with a history of  borderline hypertension who presents to Cardiology Clinic for  evaluation of an abnormal echocardiogram.  Mr. Burtch reports  approximately a 2-year history of having borderline hypertension.  He  says that the highest blood pressure that has ever been recorded for him  has been 140/100.  He says his blood pressure tends to rise when he is  under stress.  I did review his last 3 primary care physician notes and  on those his blood pressure was 118/80, 112/78, and 104/66.  Of note,  the patient had been on ramipril at 2.5 mg daily and those were blood  pressures obtained while on ramipril.  He did say that his highest blood  pressure off ramipril was 140/100.  The patient in general has no  cardiovascular-type complaints.  He is quite active.  He tries to run 3-  4 times a week.  He runs up to 6 miles at a time.  He has no shortness  of breath or chest pain with exertion.  He does report occasional  palpitations where he feels like his heart is racing, those were worse  when he was drinking more coffee.  He says he drank up to 6 cups of  coffee a day.  He has cut back considerably and he still gets the  palpitations, but they are much less common than they were in the past.  He denies any episodes of lightheadedness or syncope.  He actually has  stopped his Altace.  He had stopped it because his blood pressure was  doing so well and his blood pressure today is actually 140/100.  He does  tell me he is considerably nervous about coming to a new doctor's  office.  The echocardiogram  report from [facility] states  that his echo was noted to show severe left ventricular hypertrophy,  there does appear to be possibly some asymmetry with a septal wall of  2.4 cm and posterior wall of 2 cm.  There was no systolic anterior  motion of the mitral valve seen, there was mild mitral regurgitation.  The actual images are not available for my review.   PAST MEDICAL HISTORY:  1. Stage I hypertension.  2. Borderline hypogonadism  3. History of elevated triglycerides.  The patient was on Lovaza in      the past for that; however, he no longer takes it and his most      recent triglycerides were excellent at 87.  4. Echocardiogram done in October 2009 at [facility],      aortic root measured 4.0 cm.  There was normal LV systolic function      with  EF of 57%.  There was severe LVH.  There is some suggestion      that it was asymmetric with the septal wall measuring 2.4 cm and      posterior wall 2.0 cm.  There is no systolic anterior motion in the      mitral valve.  There was mild mitral regurgitation.  There was no      aortic stenosis or pulmonary hypertension.  The actual study images      are not available for my review and I report this from the written      report.   FAMILY HISTORY:  The patient states his grandmother died at the age of  71 from congestive heart failure and diabetes.  No history of premature  coronary artery disease.  His father has hypertension.  His parents are  both obese.  Both of his grandfathers had heart attacks before the age  of 58.  He does state that his mother's father had what sounds like an  episode of sudden cardiac death.   SOCIAL HISTORY:  The patient is married.  He has 2 children.  He is  originally from [place].  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   REVIEW OF SYSTEMS:  Negative except as noted in history of present  illness.   EKG today, normal sinus rhythm at a  rate of 48.  There is no EKG  evidence for LVH.   LABORATORY DATA:  Triglycerides 87, HDL 42, LDL 84.  LFTs are normal.  Sodium 144, potassium 5.1, creatinine is 0.98, BUN 13.  These labs were  done in September 2009.   MEDICATIONS:  The patient only uses AndroGel.   PHYSICAL EXAMINATION:  VITAL SIGNS:  Blood pressure 140/80, heart rate  is 48 and regular, weight is 200 pounds.  GENERAL:  This is a well-  developed male in no apparent distress.  NEUROLOGIC:  Alert and oriented x3.  Normal affect.  HEENT:  Normal exam.  NECK:  There is no JVD.  There is no thyromegaly or thyroid nodule.  ABDOMEN:  Soft and nontender.  No hepatosplenomegaly.  Normal bowel  sounds.  CARDIOVASCULAR:  Heart regular.  S1 and S2.  I do not hear an S3 or S4.  I do not hear a murmur.  There is no peripheral edema.  There are 2+  posterior tibial pulses bilaterally.  EXTREMITIES:  No clubbing or  cyanosis.  LUNGS:  Clear to auscultation bilaterally with normal respiratory  effort.  MUSCULOSKELETAL:  Normal.  SKIN:  Normal.   ASSESSMENT/PLAN:  This is a 40 year old with a history of stage I  hypertension who presents to Cardiology Clinic for evaluation of an  abnormal echocardiogram.  1. Abnormal echocardiogram.  The main abnormal finding on the      patient's echocardiogram appears to have been severe left      ventricular hypertrophy that is somewhat asymmetric.  The patient      does not have an impressive history of hypertension.  His highest      blood pressure that he can remember has been about 140/100 and he      has had quite well-controlled blood pressures when he has been on      Altace 2.5 mg daily.  He says that his blood pressure even before      the Altace averaged about 130/70.  He is currently off the Altace  and his blood pressure is 140/100.  I do not think that I can      explain that degree of left ventricular hypertrophy from his      hypertension.  The patient also is not a  competitive athlete and      really never has been, so I do not think that this is consistent      with an athlete's heart.  I would be concerned for hypertrophic      cardiomyopathy in his case.  At least according to the echo report,      there does not appear to be an LV outflow tract gradient or      systolic anterior motion of the mitral valve.  He really is      essentially asymptomatic except for some palpitations that he      occasionally has.  He does have a family history of premature      coronary disease in his grandfathers and his mother's father did      have what sounds like sudden cardiac death though his parents are      healthy.  I think the patient does merit further workup for      hypertrophic cardiomyopathy.  Since he has already had an echo, I      think probably my next step would be to do a cardiac MRI.  We can      further quantify the mass of his left ventricle as well as look      closely for systolic anterior motion of the mitral valve and we can      also give him gadolinium contrast and assess for any delayed      enhancement in the LV myocardium.  Often patients with hypertrophic      cardiomyopathy will have evidence for scar by hyperenhancement in      the LV myocardium.  2. Palpitations.  The patient does have a history of palpitations.      These do seem to be somewhat related to caffeine use; however,      given the fact that we are working him up for hypertrophic      cardiomyopathy, I do think it would be reasonable to obtain a 48-      hour Holter monitor to assess for any evidence for ventricular      ectopy.  3. Hypertension.  The patient's blood pressure is elevated today at      140/100.  He does state that he is quite nervous having come to the      doctor's office.  I will have him start back on his ramipril 2.5 mg      daily.  In 2 weeks I will have him come back for blood pressure      check and to check his creatinine and potassium  especially given      that his potassium was mildly elevated to 5.1 when he took Altace      in the past.  4. I will have the patient follow up in the office with me in about a      month after his testing is done.     Marca Ancona, MD  Electronically Signed    DM/MedQ  DD: 01/25/2008  DT: 01/26/2008  Job #: [id number]   cc:   Annamaria Folkerts, PA-C

## 2010-08-10 ENCOUNTER — Ambulatory Visit (INDEPENDENT_AMBULATORY_CARE_PROVIDER_SITE_OTHER): Payer: 59 | Admitting: Family Medicine

## 2010-08-10 DIAGNOSIS — I10 Essential (primary) hypertension: Secondary | ICD-10-CM

## 2010-08-10 NOTE — Progress Notes (Signed)
  Subjective:    Patient ID: Tony Murphy, male    DOB: 1970/11/10, 40 y.o.   MRN: [id number]  HPI    Review of Systems     Objective:   Physical Exam        Assessment & Plan:  Patient came into check bp after stopping bp  Bp looks excellent. Ok to continue without meds.

## 2010-09-03 ENCOUNTER — Encounter: Payer: Self-pay | Admitting: Cardiovascular Disease

## 2011-01-27 ENCOUNTER — Encounter: Payer: Self-pay | Admitting: *Deleted

## 2011-02-11 ENCOUNTER — Other Ambulatory Visit: Payer: Self-pay | Admitting: Cardiology

## 2011-02-11 DIAGNOSIS — I421 Obstructive hypertrophic cardiomyopathy: Secondary | ICD-10-CM

## 2011-02-15 ENCOUNTER — Other Ambulatory Visit (INDEPENDENT_AMBULATORY_CARE_PROVIDER_SITE_OTHER): Payer: 59 | Admitting: *Deleted

## 2011-02-15 DIAGNOSIS — I059 Rheumatic mitral valve disease, unspecified: Secondary | ICD-10-CM

## 2011-02-15 DIAGNOSIS — I421 Obstructive hypertrophic cardiomyopathy: Secondary | ICD-10-CM

## 2011-02-18 ENCOUNTER — Encounter: Payer: Self-pay | Admitting: Cardiology

## 2011-02-18 ENCOUNTER — Other Ambulatory Visit: Payer: 59 | Admitting: *Deleted

## 2011-03-10 ENCOUNTER — Encounter: Payer: Self-pay | Admitting: Cardiology

## 2011-03-10 ENCOUNTER — Ambulatory Visit (INDEPENDENT_AMBULATORY_CARE_PROVIDER_SITE_OTHER): Payer: 59 | Admitting: Cardiology

## 2011-03-10 DIAGNOSIS — I10 Essential (primary) hypertension: Secondary | ICD-10-CM

## 2011-03-10 DIAGNOSIS — I421 Obstructive hypertrophic cardiomyopathy: Secondary | ICD-10-CM

## 2011-03-10 DIAGNOSIS — I429 Cardiomyopathy, unspecified: Secondary | ICD-10-CM

## 2011-03-10 NOTE — Assessment & Plan Note (Signed)
BP actually seems well-controlled off ACEI and just on Toprol XL.

## 2011-03-10 NOTE — Assessment & Plan Note (Signed)
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  Low risk for ventricular arrhythmias by cardiac MRI, holter, and ETT.  No murmur on exam.  Continue Toprol XL.  His daughter is 29, current recommendations suggest screening echoes yearly from ages 41-18 then q 5 yrs.  I will have his daughter get an echo with Dr. Meredeth Ide in [place], will ask him to comment on necessary screening interval.

## 2011-03-10 NOTE — Progress Notes (Signed)
PCP: Dr. Dayton Martes  40 yo with history of hypertrophic cardiomyopathy presents for followup. He has been doing well over the last year. He exercises regularly with no exercise intolerance, lightheadedness, or chest pain. No episodes of syncope. His children have been screened by echo, as have his parents. No one else has an abnormal echocardiogram. His most recent echo showed stable significant asymmetric hypertrophy of the basal septum. No LVOT gradient or systolic anterior motion of the mitral valve. BP is good today.  He stopped lisinopril due to cough but continues on Toprol XL.   ECG: NSR, delayed anterior R wave progression  Labs (12/09): lipoprotein(a) = 5  Labs (6/11): LDL 127, HDL 40   Family History:  The patient states that his grandmother died at age 34 from congestive heart failure and diabetes. His father has hypertension. He had 1 grandfather who had a heart attack before the age of 48. His other grandfather had what sounds like an episode of sudden cardiac death before the age of 62. Mother with atrial fibrillation.   Social History:  The patient is married. He has 2 children. He is originally from [place]. He was here working as Health visitor. His wife is a Land. He does not smoke or use any illicit drugs.   Past Medical History:  1. Stage I hypertension. ACEI cough.  2. Borderline hypogonadism.  3. History of elevated triglycerides. The patient was on Lovaza on the past for this; however, he no longer takes it, and his most recent triglycerides were excellent at 87.  4. Hypertrophic cardiomyopathy. Cardiac MRI was done December 2009, showing EF of 66%. There is focal basal septal hypertrophy. The maximum thickness at the basal septum is 2.2 cm. There is no systolic anterior motion of the mitral valve, so an LV outflow tract gradient is unlikely. There was no myocardial delayed enhancement. Holter monitor was also done in November 2009 and was essentially normal  with no PVCs or runs of NSVT. ETT showed good exercise tolerance and normal blood pressure response. Echo (12/10) showed severe focal basal septal asymmetric hypertrophy. No SAM or LV outflow tract gradient. EF 55-60%. Mild MR. Parents and children have been screened for HCM by echo with no suggestive findings.   Current Outpatient Prescriptions  Medication Sig Dispense Refill  . metoprolol (TOPROL-XL) 50 MG 24 hr tablet Take 50 mg by mouth daily.          BP 124/68  Pulse 54  Ht 5\' 11"  (1.803 m)  Wt 106.142 kg (234 lb)  BMI 32.64 kg/m2 General: NAD Neck: No JVD, no thyromegaly or thyroid nodule.  Lungs: Clear to auscultation bilaterally with normal respiratory effort. CV: Nondisplaced PMI.  Heart regular S1/S2, no S3/S4, no murmur.  No peripheral edema.  No carotid bruit.  Normal pedal pulses.  Abdomen: Soft, nontender, no hepatosplenomegaly, no distention.  Neurologic: Alert and oriented x 3.  Psych: Normal affect. Extremities: No clubbing or cyanosis.

## 2011-03-10 NOTE — Patient Instructions (Signed)
Your physician recommends that you schedule a follow-up appointment in: 1 year  

## 2011-05-10 ENCOUNTER — Other Ambulatory Visit: Payer: Self-pay | Admitting: Cardiology

## 2011-05-10 MED ORDER — METOPROLOL SUCCINATE ER 50 MG PO TB24: 50.0000 mg | ORAL_TABLET | Freq: Every day | ORAL | Status: DC

## 2011-05-18 ENCOUNTER — Other Ambulatory Visit: Payer: Self-pay | Admitting: *Deleted

## 2011-05-18 MED ORDER — METOPROLOL SUCCINATE ER 50 MG PO TB24: 50.0000 mg | ORAL_TABLET | Freq: Every day | ORAL | Status: DC

## 2011-05-18 NOTE — Telephone Encounter (Signed)
pt [facility] with [facility] quanity and wrong refill #, rx resent today. Danielle Rankin

## 2012-03-12 ENCOUNTER — Ambulatory Visit (INDEPENDENT_AMBULATORY_CARE_PROVIDER_SITE_OTHER): Payer: 59 | Admitting: Cardiovascular Disease

## 2012-03-12 ENCOUNTER — Encounter: Payer: Self-pay | Admitting: Cardiovascular Disease

## 2012-03-12 VITALS — BP 130/82 | HR 64 | Ht 71.0 in | Wt 243.0 lb

## 2012-03-12 DIAGNOSIS — I77819 Aortic ectasia, unspecified site: Secondary | ICD-10-CM

## 2012-03-12 DIAGNOSIS — I429 Cardiomyopathy, unspecified: Secondary | ICD-10-CM

## 2012-03-12 DIAGNOSIS — I428 Other cardiomyopathies: Secondary | ICD-10-CM

## 2012-03-12 MED ORDER — METOPROLOL SUCCINATE ER 50 MG PO TB24: 50.0000 mg | ORAL_TABLET | Freq: Every day | ORAL | Status: DC

## 2012-03-12 NOTE — Patient Instructions (Addendum)
Continue same medication.  Follow up in 1 year.

## 2012-03-14 ENCOUNTER — Encounter: Payer: Self-pay | Admitting: Cardiovascular Disease

## 2012-03-14 DIAGNOSIS — I77819 Aortic ectasia, unspecified site: Secondary | ICD-10-CM | POA: Insufficient documentation

## 2012-03-14 NOTE — Progress Notes (Signed)
HPI  This is a 42 year old male  with history of hypertrophic cardiomyopathy presents for annual followup. He saw Dr. Shirlee Latch in the past. He has been doing well over the last year. He exercises regularly with no exercise intolerance, lightheadedness, or chest pain. No episodes of syncope. His children have been screened by echo, as have his parents. No one else has an abnormal echocardiogram. His most recent echo showed stable significant asymmetric hypertrophy of the basal septum. No LVOT gradient or systolic anterior motion of the mitral valve. BP is good today. He is on Toprol XL. Last echocardiogram was a year ago. The aortic root was mildly dilated.   Not on File   Current Outpatient Prescriptions on File Prior to Visit  Medication Sig Dispense Refill  . metoprolol succinate (TOPROL-XL) 50 MG 24 hr tablet Take 1 tablet (50 mg total) by mouth daily.  90 tablet  3     Past Medical History  Diagnosis Date  . Hypertension     stage I  . Hypogonadism male   . Hyperlipidemia   . Hypertrophic cardiomyopathy      Past Surgical History  Procedure Date  . Appendectomy   . Tonsillectomy   . Hernia repair      Family History  Problem Relation Age of Onset  . Hypertension Father   . Heart attack Father   . Heart disease Maternal Grandmother   . Heart attack Maternal Grandfather   . Atrial fibrillation Mother   . Heart attack Paternal Grandfather     sudden cardiac death     History   Social History  . Marital Status: Married    Spouse Name: N/A    Number of Children: N/A  . 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 Smoking status: Never Smoker   . Smokeless tobacco: Not on file  . Alcohol Use: No  . Drug Use: No  . Sexually Active: Not on file   Other Topics Concern  . Not on file   Social History Narrative  . No narrative on file       PHYSICAL EXAM   BP 130/82  Pulse 64  Ht 5'  11" (1.803 m)  Wt 243 lb (110.224 kg)  BMI 33.89 kg/m2 Constitutional: He is oriented to person, place, and time. He appears well-developed and well-nourished. No distress.  HENT: No nasal discharge.  Head: Normocephalic and atraumatic.  Eyes: Pupils are equal and round. Right eye exhibits no discharge. Left eye exhibits no discharge.  Neck: Normal range of motion. Neck supple. No JVD present. No thyromegaly present.  Cardiovascular: Normal rate, regular rhythm, normal heart sounds and. Exam reveals no gallop and no friction rub. No murmur heard.  Pulmonary/Chest: Effort normal and breath sounds normal. No stridor. No respiratory distress. He has no wheezes. He has no rales. He exhibits no tenderness.  Abdominal: Soft. Bowel sounds are normal. He exhibits no distension. There is no tenderness. There is no rebound and no guarding.  Musculoskeletal: Normal range of motion. He exhibits no edema and no tenderness.  Neurological: He is alert and oriented to person, place, and time. Coordination normal.  Skin: Skin is warm and dry. No rash noted. He is not diaphoretic. No erythema. No pallor.  Psychiatric: He has a normal mood and affect. His behavior is normal. Judgment and thought content normal.     EKG: Normal sinus rhythm with nonspecific  ST changes.     ASSESSMENT AND PLAN

## 2012-03-14 NOTE — Assessment & Plan Note (Signed)
His ascending aorta was 4 cm on echocardiogram from last year. I will repeat echocardiogram next year to check on this as well as his hypertrophic cardiomyopathy.

## 2012-03-14 NOTE — Assessment & Plan Note (Signed)
He is doing very well from a cardiac standpoint with no symptoms related to his condition. Continue treatment with Toprol.

## 2012-05-02 ENCOUNTER — Telehealth: Payer: Self-pay | Admitting: *Deleted

## 2012-05-02 NOTE — Telephone Encounter (Signed)
Pt would like to see dr Kirke Corin this week, states his hear t"fluttered " yesterday.

## 2012-05-03 ENCOUNTER — Encounter: Payer: Self-pay | Admitting: Family Medicine

## 2012-05-03 ENCOUNTER — Telehealth: Payer: Self-pay | Admitting: *Deleted

## 2012-05-03 ENCOUNTER — Other Ambulatory Visit: Payer: Self-pay | Admitting: *Deleted

## 2012-05-03 ENCOUNTER — Ambulatory Visit (INDEPENDENT_AMBULATORY_CARE_PROVIDER_SITE_OTHER): Payer: 59 | Admitting: Family Medicine

## 2012-05-03 VITALS — BP 132/92 | HR 64 | Temp 98.1°F | Wt 238.8 lb

## 2012-05-03 DIAGNOSIS — R002 Palpitations: Secondary | ICD-10-CM

## 2012-05-03 HISTORY — DX: Palpitations: R00.2

## 2012-05-03 LAB — TSH: TSH: 1.45 u[IU]/mL (ref 0.35–5.50)

## 2012-05-03 LAB — CBC WITH DIFFERENTIAL/PLATELET
Basophils Relative: 0.4 % (ref 0.0–3.0)
Eosinophils Absolute: 0.1 10*3/uL (ref 0.0–0.7)
Eosinophils Relative: 2.1 % (ref 0.0–5.0)
Lymphocytes Relative: 30.4 % (ref 12.0–46.0)
MCHC: 34.4 g/dL (ref 30.0–36.0)
MCV: 91 fl (ref 78.0–100.0)
Monocytes Absolute: 0.5 10*3/uL (ref 0.1–1.0)
Neutrophils Relative %: 59.9 % (ref 43.0–77.0)
Platelets: 320 10*3/uL (ref 150.0–400.0)
RBC: 5.14 Mil/uL (ref 4.22–5.81)
WBC: 6.5 10*3/uL (ref 4.5–10.5)

## 2012-05-03 LAB — BASIC METABOLIC PANEL
BUN: 10 mg/dL (ref 6–23)
Calcium: 9.6 mg/dL (ref 8.4–10.5)
Chloride: 106 mEq/L (ref 96–112)
Creatinine, Ser: 0.9 mg/dL (ref 0.4–1.5)

## 2012-05-03 NOTE — Progress Notes (Signed)
  Subjective:    Patient ID: Tony Murphy, male    DOB: 12-Oct-1970, 42 y.o.   MRN: [id number]  HPI CC: palpitations  2d ago started feeling heart palpitations.  Yesterday sxs started again.  Feels like heart skipping beats then speeds up.  After this episode tends to get HA described as blood rushing to head.     Currently feels heart beating normally but on drive here had 2 episodes of heart speeding.  No chest pain/tightness, SOB, cough, leg swelling.  No recent viral illness.  Called cardiologist - Dr. Kirke Corin.  Pending f/u with him.  Last saw 02/2012.  Last echo 02/2011 with asymmetric septal hypertrophy  fmhx - CAD in father and both grandfathers.  Mother with afib.  Caffeine - 8-10 cups coffee/day (1/2 decaf, 1/2 regular).  For last 2 days not having any coffee. Intermittent light EtOH. No new medications, OTC meds. Using tylenol for headache.  Past Medical History  Diagnosis Date  . Hypertension     stage I  . Hypogonadism male   . Hyperlipidemia   . Hypertrophic cardiomyopathy    Past Surgical History  Procedure Laterality Date  . Appendectomy    . Tonsillectomy    . Hernia repair     Family History  Problem Relation Age of Onset  . Hypertension Father   . Heart attack Father   . Heart disease Maternal Grandmother   . Heart attack Maternal Grandfather   . Atrial fibrillation Mother   . Heart attack Paternal Grandfather     sudden cardiac death   Review of Systems Per HPI    Objective:   Physical Exam  Nursing note and vitals reviewed. Constitutional: He appears well-developed and well-nourished. No distress.  HENT:  Head: Normocephalic and atraumatic.  Mouth/Throat: Oropharynx is clear and moist. No oropharyngeal exudate.  Eyes: Conjunctivae and EOM are normal. Pupils are equal, round, and reactive to light.  Neck: Normal range of motion. Neck supple.  Cardiovascular: Normal rate, regular rhythm, normal heart sounds and intact distal pulses.   No  murmur heard. Pulmonary/Chest: Effort normal and breath sounds normal. No respiratory distress. He has no wheezes. He has no rales.  Musculoskeletal: He exhibits no edema.       Assessment & Plan:

## 2012-05-03 NOTE — Patient Instructions (Signed)
Blood work to check on palpitations today We will set you up with holter monitor  - pass by Marion's office for this. Then schedule appointment with Dr. Kirke Corin after holter.

## 2012-05-03 NOTE — Addendum Note (Signed)
Addended by: Eustaquio Boyden on: 05/03/2012 10:46 AM   Modules accepted: Orders

## 2012-05-03 NOTE — Telephone Encounter (Signed)
Called [facility] spoke with Tony Murphy to let them know pt is needing monitor for today. They requested I fax over the order and pt information and they will give me a call back to see when pt can come in to have 48 hour monitor placed.

## 2012-05-03 NOTE — Assessment & Plan Note (Signed)
New, in h/o hyertrophic CM. Check TSH, CBC, BMP today. Obtain holter and refer to Dr. Kirke Corin. Did not increase toprol xl given HR 64 today. Recommended diminish caffeine as he has already done.  EKG today- sinus arrhythmia at rate of 50-60s, normal axis, intervals, no hypertrophy.  Nonspecific ST changes.

## 2012-05-03 NOTE — Telephone Encounter (Signed)
Message copied by Kendrick Fries on Thu May 03, 2012 11:30 AM ------      Message from: MCNEAL, MELISSA E      Created: Thu May 03, 2012 10:50 AM         Can you please order monitor via [facility] if we do not have any? Thanks!  He has appt with Dr. Kirke Corin next Thursday. It would be great if [facility] can put this on this week so we can have results by then :)      Thanks!!!      ----- Message -----         From: Edman Circle         Sent: 05/03/2012  10:48 AM           To: Marcelle Overlie, RN            Pt needs monitor per Dr Buena Irish       ------

## 2012-05-04 ENCOUNTER — Encounter: Payer: Self-pay | Admitting: *Deleted

## 2012-05-04 ENCOUNTER — Telehealth: Payer: Self-pay | Admitting: *Deleted

## 2012-05-04 NOTE — Telephone Encounter (Signed)
Called Consuella Lose due to not hearing back from [facility] for pt to have 48 hour holter monitor placed. She mentioned that they don't technically place monitors on Friday. She will try to see if she can fit him in today but if not the soonest will have to be Monday 05/07/12 and we should have report back by pt appoint 05/10/12. She will contact pt to see if he can come in and have monitor placed.  I called pt 05/04/12 to let him know the address and details about holter monitor so he will know to expect their call.

## 2012-05-07 DIAGNOSIS — R002 Palpitations: Secondary | ICD-10-CM

## 2012-05-10 ENCOUNTER — Encounter: Payer: Self-pay | Admitting: Cardiovascular Disease

## 2012-05-10 ENCOUNTER — Ambulatory Visit (INDEPENDENT_AMBULATORY_CARE_PROVIDER_SITE_OTHER): Payer: 59 | Admitting: Cardiovascular Disease

## 2012-05-10 VITALS — BP 130/92 | HR 68 | Ht 71.0 in | Wt 238.5 lb

## 2012-05-10 DIAGNOSIS — I77819 Aortic ectasia, unspecified site: Secondary | ICD-10-CM

## 2012-05-10 DIAGNOSIS — I429 Cardiomyopathy, unspecified: Secondary | ICD-10-CM

## 2012-05-10 DIAGNOSIS — I10 Essential (primary) hypertension: Secondary | ICD-10-CM

## 2012-05-10 DIAGNOSIS — R002 Palpitations: Secondary | ICD-10-CM

## 2012-05-10 NOTE — Assessment & Plan Note (Signed)
He seems to be stable with no worsening symptoms. He will need a followup echocardiogram in a year.

## 2012-05-10 NOTE — Assessment & Plan Note (Signed)
This will be monitored by echocardiogram.

## 2012-05-10 NOTE — Assessment & Plan Note (Addendum)
His symptoms correlated with PVCs but these were actually not frequent. His symptoms improved significantly without intervention after decreasing caffeine intake. Continue treatment with Toprol. I asked him to notify us for worsening symptoms especially with his known history of hypertrophic cardiomyopathy. Currently there is no indication for an ICD.

## 2012-05-10 NOTE — Progress Notes (Signed)
HPI  This is a 42 year old male  with history of hypertrophic cardiomyopathy who is here today to discuss the results of further monitor after recent palpitations.  His most recent echo in December 2012 showed stable significant asymmetric hypertrophy of the basal septum. No LVOT gradient or systolic anterior motion of the mitral valve.  He was seen last week by Dr. Sharen Hones for increased palpitations. The patient felt skipping in his heart followed by brief tachycardia. These episodes were only lasting for a few seconds at a time. He was under stress that day. He cut down on caffeine intake significantly. He had a Holter monitor done which showed occasional PVCs but no other arrhythmias. There was no evidence of atrial fibrillation or nonsustained ventricular tachycardia. Total PVC count was only 203. His symptoms correlated with PVCs. He reports significant improvement in his symptoms without intervention. He had routine labs done which were unremarkable including normal thyroid function.  No Known Allergies   Current Outpatient Prescriptions on File Prior to Visit  Medication Sig Dispense Refill  . metoprolol succinate (TOPROL-XL) 50 MG 24 hr tablet Take 1 tablet (50 mg total) by mouth daily.  90 tablet  3   No current facility-administered medications on file prior to visit.     Past Medical History  Diagnosis Date  . Hypertension     stage I  . Hypogonadism male   . Hyperlipidemia   . Hypertrophic cardiomyopathy      Past Surgical History  Procedure Laterality Date  . Appendectomy    . Tonsillectomy    . Hernia repair       Family History  Problem Relation Age of Onset  . Hypertension Father   . Heart attack Father   . Heart disease Maternal Grandmother   . Heart attack Maternal Grandfather   . Atrial fibrillation Mother   . Heart attack Paternal Grandfather     sudden cardiac death     History   Social History  . Marital Status: Married    Spouse Name:  N/A    Number of Children: N/A  . 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 Smoking status: Never Smoker   . Smokeless tobacco: Never Used  . Alcohol Use: No  . Drug Use: No  . Sexually Active: Not on file   Other Topics Concern  . Not on file   Social History Narrative  . No narrative on file       PHYSICAL EXAM   BP 130/92  Pulse 68  Ht 5\' 11"  (1.803 m)  Wt 238 lb 8 oz (108.183 kg)  BMI 33.28 kg/m2 Constitutional: He is oriented to person, place, and time. He appears well-developed and well-nourished. No distress.  HENT: No nasal discharge.  Head: Normocephalic and atraumatic.  Eyes: Pupils are equal and round. Right eye exhibits no discharge. Left eye exhibits no discharge.  Neck: Normal range of motion. Neck supple. No JVD present. No thyromegaly present.  Cardiovascular: Normal rate, regular rhythm, normal heart sounds and. Exam reveals no gallop and no friction rub. No murmur heard.  Pulmonary/Chest: Effort normal and breath sounds normal. No stridor. No respiratory distress. He has no wheezes. He has no rales. He exhibits no tenderness.  Abdominal: Soft. Bowel sounds are normal. He exhibits no distension. There is no tenderness. There is no rebound and no guarding.  Musculoskeletal: Normal range of motion. He exhibits no  edema and no tenderness.  Neurological: He is alert and oriented to person, place, and time. Coordination normal.  Skin: Skin is warm and dry. No rash noted. He is not diaphoretic. No erythema. No pallor.  Psychiatric: He has a normal mood and affect. His behavior is normal. Judgment and thought content normal.     EKG: Normal sinus rhythm. Normal ECG     ASSESSMENT AND PLAN

## 2012-05-10 NOTE — Patient Instructions (Addendum)
Continue same medications  Follow up in 1 year

## 2012-05-11 ENCOUNTER — Other Ambulatory Visit: Payer: Self-pay | Admitting: *Deleted

## 2012-05-11 DIAGNOSIS — R002 Palpitations: Secondary | ICD-10-CM

## 2012-05-23 ENCOUNTER — Telehealth: Payer: Self-pay

## 2012-05-23 NOTE — Telephone Encounter (Signed)
Message copied by Marcelle Overlie on Wed May 23, 2012  9:21 AM ------      Message from: Lorine Bears A      Created: Tue May 22, 2012  5:25 PM       I saw Mr. Fawaz on 2/27 regarding PVCs and palpitations. Please let him know that I discussed his case with Dr. Graciela Husbands and Dr. Ladona Ridgel given that he has hypertrophic cardiomyopathy. I want him to be seen by one of them for a consultation regarding the possible need for an ICD.       I cc ed Dr. Sharen Hones to keep him informed.   ------

## 2012-05-23 NOTE — Telephone Encounter (Signed)
See below

## 2012-05-23 NOTE — Telephone Encounter (Signed)
lmtcb

## 2012-05-31 NOTE — Telephone Encounter (Signed)
lmtcb

## 2012-05-31 NOTE — Telephone Encounter (Signed)
Pt informed. tx to [facility] to make appt 

## 2012-07-10 ENCOUNTER — Encounter: Payer: Self-pay | Admitting: Family Medicine

## 2012-07-10 ENCOUNTER — Ambulatory Visit (INDEPENDENT_AMBULATORY_CARE_PROVIDER_SITE_OTHER): Payer: 59 | Admitting: Family Medicine

## 2012-07-10 ENCOUNTER — Ambulatory Visit (INDEPENDENT_AMBULATORY_CARE_PROVIDER_SITE_OTHER)
Admission: RE | Admit: 2012-07-10 | Discharge: 2012-07-10 | Disposition: A | Discharge status: Home / Self Care | Payer: 59 | Source: Ambulatory Visit | Attending: Family Medicine | Admitting: Family Medicine

## 2012-07-10 VITALS — BP 120/84 | HR 68 | Temp 97.8°F | Wt 235.0 lb

## 2012-07-10 DIAGNOSIS — M25519 Pain in unspecified shoulder: Secondary | ICD-10-CM

## 2012-07-10 DIAGNOSIS — M25512 Pain in left shoulder: Secondary | ICD-10-CM

## 2012-07-10 NOTE — Progress Notes (Signed)
SUBJECTIVE: Tony Murphy is a 42 y.o. male who sustained here for left shoulder pain x 2-3 months.  No known injury.  Does sleep on his left arm bent, has always slept that way.   Painful lifting arm overhead or behind back.  Nagging pain and feels like now he has decreased ROM. No UE weakness, no radiculopathy.  Has not tried to take anything for the pain.  No h/o previous shoulder problems or surgery.  Patient Active Problem List   Diagnosis Date Noted  . Left shoulder pain 07/10/2012  . Palpitations 05/03/2012  . 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 HYPERLIPIDEMIA-MIXED 03/12/2009  . CARDIOMYOPATHY, HYPERTROPHIC 03/12/2009  . HYPERTENSION, UNSPECIFIED 12/31/2008  . CARDIOMYOPATHY, SECONDARY 12/31/2008   Past Medical History  Diagnosis Date  . Hypertension     stage I  . Hypogonadism male   . Hyperlipidemia   . Hypertrophic cardiomyopathy    Past Surgical History  Procedure Laterality Date  . Appendectomy    . Tonsillectomy    . Hernia repair     History  Substance Use Topics  . Smoking status: Never Smoker   . Smokeless tobacco: Never Used  . Alcohol Use: No   Family History  Problem Relation Age of Onset  . Hypertension Father   . Heart attack Father   . Heart disease Maternal Grandmother   . Heart attack Maternal Grandfather   . Atrial fibrillation Mother   . Heart attack Paternal Grandfather     sudden cardiac death   No Known Allergies Current Outpatient Prescriptions on File Prior to Visit  Medication Sig Dispense Refill  . acetaminophen (TYLENOL) 325 MG tablet Take 650 mg by mouth every 6 (six) hours as needed for pain.      . metoprolol succinate (TOPROL-XL) 50 MG 24 hr tablet Take 1 tablet (50 mg total) by mouth daily.  90 tablet  3   No current facility-administered medications on file prior to visit.   The PMH, PSH, Social History, Family History, Medications, and allergies have been reviewed in [facility], and have been  updated if relevant.  OBJECTIVE:  BP 120/84  Pulse 68  Temp(Src) 97.8 F (36.6 C)  Wt 235 lb (106.595 kg)  BMI 32.79 kg/m2  Appearance: alert, well appearing, and in no distress. Shoulder exam: Left shoulder:  Normal to inspection and palpation.  Neg empty can left. Pos arch sign.  Pain with touching thumb to back.  X-ray: ordered, but results not yet available.  ASSESSMENT: Shoulder rotator cuff impingement  PLAN: rest the injured area as much as practical, X-Ray ordered, prescription for NSAID given, referral to Physical Therapy See orders for this visit as documented in the electronic medical record.

## 2012-07-10 NOTE — Patient Instructions (Addendum)
You have rotator cuff impingement Try to avoid painful activities (overhead activities, lifting with extended arm) as much as possible. Aleve and/or tylenol as needed for pain.  Start physical therapy with transition to home exercise program.  See Shirlee Limerick about your referral before you leave today.   If not improving at follow-up we will consider further imaging and/or physical therapy. Subacromial (steroid) injection may be beneficial to help with pain and to decrease inflammation.  We will call you with your xray results.

## 2012-07-26 ENCOUNTER — Encounter: Payer: Self-pay | Admitting: Internal Medicine

## 2012-07-26 ENCOUNTER — Ambulatory Visit (INDEPENDENT_AMBULATORY_CARE_PROVIDER_SITE_OTHER): Payer: 59 | Admitting: Internal Medicine

## 2012-07-26 VITALS — BP 118/98 | HR 53 | Ht 71.0 in | Wt 236.0 lb

## 2012-07-26 DIAGNOSIS — R000 Tachycardia, unspecified: Secondary | ICD-10-CM

## 2012-07-26 NOTE — Progress Notes (Signed)
HPI Tony Murphy is referred today by Dr. Kirke Corin for evaluation of HCM and to assess his risk for sudden cardiac death. He is a very pleasant 42 year old man who was diagnosed with hypertrophic cardiomyopathy several years ago. His health is been good. He exercises regularly. He has never had syncope. He has rare palpitations, and wore a cardiac monitor demonstrating only a couple of 100 PVCs in a 24-hour period. There is no family history of sudden cardiac death. He notes that his grandfathers also died in her 74s, but both smoked and drank in excess. There was thought to have died of natural causes. His parents are alive and well. No siblings have any history of sudden death or cardiomyopathy. With exertion, the patient is not limited. He does not get dizzy or lightheaded. His maximal septal dimension is 19 mm by echo. No Known Allergies   Current Outpatient Prescriptions  Medication Sig Dispense Refill  . acetaminophen (TYLENOL) 325 MG tablet Take 650 mg by mouth every 6 (six) hours as needed for pain.      . metoprolol succinate (TOPROL-XL) 50 MG 24 hr tablet Take 1 tablet (50 mg total) by mouth daily.  90 tablet  3  . naproxen sodium (ANAPROX) 220 MG tablet Take 220 mg by mouth 2 (two) times daily with a meal.       No current facility-administered medications for this visit.     Past Medical History  Diagnosis Date  . Hypertension     stage I  . Hypogonadism male   . Hyperlipidemia   . Hypertrophic cardiomyopathy     ROS:   All systems reviewed and negative except as noted in the HPI.   Past Surgical History  Procedure Laterality Date  . Appendectomy    . Tonsillectomy    . Hernia repair       Family History  Problem Relation Age of Onset  . Hypertension Father   . Heart attack Father   . Heart disease Maternal Grandmother   . Heart attack Maternal Grandfather   . Atrial fibrillation Mother   . Heart attack Paternal Grandfather     sudden cardiac death      History   Social History  . Marital Status: Married    Spouse Name: N/A    Number of Children: N/A  . 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 Smoking status: Never Smoker   . Smokeless tobacco: Never Used  . Alcohol Use: No  . Drug Use: No  . Sexually Active: Not on file   Other Topics Concern  . Not on file   Social History Narrative  . No narrative on file     BP 118/98  Pulse 53  Ht 5\' 11"  (1.803 m)  Wt 236 lb (107.049 kg)  BMI 32.93 kg/m2  Physical Exam:  Well appearing 42 year old man,NAD HEENT: Unremarkable Neck:  7 cm JVD, no thyromegally Back:  No CVA tenderness Lungs:  Clear with no wheezes, rales, or rhonchi. HEART:  Regular rate rhythm, no murmurs, no rubs, no clicks Abd:  soft, positive bowel sounds, no organomegally, no rebound, no guarding Ext:  2 plus pulses, no edema, no cyanosis, no clubbing Skin:  No rashes no nodules Neuro:  CN II through XII intact, motor grossly intact  EKG- Normal sinus rhythm with sinus bradycardia.    Assess/Plan:

## 2012-07-26 NOTE — Patient Instructions (Addendum)
Your physician wants you to follow-up in: as needed with Dr. Kirke Corin. You will receive a reminder letter in the mail two months in advance. If you don't receive a letter, please call our office to schedule the follow-up appointment.

## 2012-07-26 NOTE — Assessment & Plan Note (Signed)
Based on his lack of family history, lack of syncopal history, exercise tolerance, and septal dimension, the patient is at extremely low risk for sudden cardiac death.  I have recommended that he not undergo maximal high intensity activity. Exercise should be in moderation and I've reviewed this with him in detail. I do agree with continued beta blocker therapy. Avoidance of excessive caffeine intake and alcohol are also recommended. I will see him back as needed.

## 2013-04-10 ENCOUNTER — Other Ambulatory Visit: Payer: Self-pay | Admitting: *Deleted

## 2013-04-10 MED ORDER — METOPROLOL SUCCINATE ER 50 MG PO TB24: 50.0000 mg | ORAL_TABLET | Freq: Every day | ORAL | Status: DC

## 2013-04-10 NOTE — Telephone Encounter (Signed)
Patient want [facility] w/ 90 day supply

## 2013-04-15 ENCOUNTER — Other Ambulatory Visit: Payer: Self-pay | Admitting: Cardiovascular Disease

## 2013-04-15 ENCOUNTER — Other Ambulatory Visit: Payer: Self-pay | Admitting: *Deleted

## 2013-04-15 MED ORDER — METOPROLOL SUCCINATE ER 50 MG PO TB24: 50.0000 mg | ORAL_TABLET | Freq: Every day | ORAL | Status: DC

## 2013-04-15 NOTE — Telephone Encounter (Signed)
Requested Prescriptions   Signed Prescriptions Disp Refills  . metoprolol succinate (TOPROL-XL) 50 MG 24 hr tablet 90 tablet 3    Sig: Take 1 tablet (50 mg total) by mouth daily.    Authorizing Provider: ARIDA, MUHAMMAD A    Ordering User: LOPEZ, MARINA C    

## 2013-05-07 ENCOUNTER — Ambulatory Visit: Payer: 59 | Admitting: Cardiovascular Disease

## 2013-06-21 ENCOUNTER — Ambulatory Visit (INDEPENDENT_AMBULATORY_CARE_PROVIDER_SITE_OTHER): Payer: 59 | Admitting: Cardiovascular Disease

## 2013-06-21 ENCOUNTER — Encounter (INDEPENDENT_AMBULATORY_CARE_PROVIDER_SITE_OTHER): Payer: Self-pay

## 2013-06-21 ENCOUNTER — Encounter: Payer: Self-pay | Admitting: Cardiovascular Disease

## 2013-06-21 VITALS — BP 122/88 | HR 50 | Ht 71.0 in | Wt 240.0 lb

## 2013-06-21 DIAGNOSIS — I77819 Aortic ectasia, unspecified site: Secondary | ICD-10-CM

## 2013-06-21 NOTE — Assessment & Plan Note (Signed)
This will be monitored by echocardiogram. He is very stable at this point. I will plan on doing an echocardiogram next year.

## 2013-06-21 NOTE — Assessment & Plan Note (Signed)
He seems to be stable with no no significant symptoms. Continue treatment with Toprol. [facility]. He is interested in genetic testing as this will help determine the frequency of screening for his children. I will check on this.

## 2013-06-21 NOTE — Patient Instructions (Signed)
Continue same medication    Your physician wants you to follow-up in: 1 year. You will receive a reminder letter in the mail two months in advance. If you don't receive a letter, please call our office to schedule the follow-up appointment.  

## 2013-06-21 NOTE — Progress Notes (Signed)
HPI  This is a 43 year old male  with history of hypertrophic cardiomyopathy who is here today for a followup visit. Most recent echo in December 2012 showed stable significant asymmetric hypertrophy of the basal septum. No LVOT gradient or systolic anterior motion of the mitral valve. Previous cardiac MRI showed maximal thickness of the basal septum of 2.2 cm. He had palpitations last year.  Holter monitor done  showed occasional PVCs but no other arrhythmias. There was no evidence of atrial fibrillation or nonsustained ventricular tachycardia. Total PVC count was only 203. His symptoms correlated with PVCs. He was evaluated by Dr. Ladona Ridgel who felt that he was at low risk for malignant arrhythmia. He has been doing very well and denies any chest pain, dyspnea or palpitations.  No Known Allergies   Current Outpatient Prescriptions on File Prior to Visit  Medication Sig Dispense Refill  . metoprolol succinate (TOPROL-XL) 50 MG 24 hr tablet TAKE 1 TABLET DAILY  90 tablet  3   No current facility-administered medications on file prior to visit.     Past Medical History  Diagnosis Date  . Hypertension     stage I  . Hypogonadism male   . Hyperlipidemia   . Hypertrophic cardiomyopathy      Past Surgical History  Procedure Laterality Date  . Appendectomy    . Tonsillectomy    . Hernia repair       Family History  Problem Relation Age of Onset  . Hypertension Father   . Heart attack Father   . Heart disease Maternal Grandmother   . Heart attack Maternal Grandfather   . Atrial fibrillation Mother   . Heart attack Paternal Grandfather     sudden cardiac death     History   Social History  . Marital Status: Married    Spouse Name: N/A    Number of Children: N/A  . 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 Smoking status: Never Smoker   . Smokeless tobacco: Never Used  . Alcohol Use: No  . Drug  Use: No  . Sexual Activity: Not on file   Other Topics Concern  . Not on file   Social History Narrative  . No narrative on file       PHYSICAL EXAM   BP 122/88  Pulse 50  Ht 5\' 11"  (1.803 m)  Wt 240 lb (108.863 kg)  BMI 33.49 kg/m2 Constitutional: He is oriented to person, place, and time. He appears well-developed and well-nourished. No distress.  HENT: No nasal discharge.  Head: Normocephalic and atraumatic.  Eyes: Pupils are equal and round. Right eye exhibits no discharge. Left eye exhibits no discharge.  Neck: Normal range of motion. Neck supple. No JVD present. No thyromegaly present.  Cardiovascular: Normal rate, regular rhythm, normal heart sounds and. Exam reveals no gallop and no friction rub. No murmur heard.  Pulmonary/Chest: Effort normal and breath sounds normal. No stridor. No respiratory distress. He has no wheezes. He has no rales. He exhibits no tenderness.  Abdominal: Soft. Bowel sounds are normal. He exhibits no distension. There is no tenderness. There is no rebound and no guarding.  Musculoskeletal: Normal range of motion. He exhibits no edema and no tenderness.  Neurological: He is alert and oriented to person, place, and time. Coordination normal.  Skin: Skin is warm and dry. No rash noted. He is not diaphoretic. No erythema. No pallor.  Psychiatric: He has a normal mood and affect. His behavior is normal. Judgment and thought content normal.     EKG: Sinus bradycardia. Normal ECG     ASSESSMENT AND PLAN

## 2013-07-02 ENCOUNTER — Telehealth: Payer: Self-pay | Admitting: *Deleted

## 2013-07-02 NOTE — Telephone Encounter (Signed)
Informed patient of Dr. Aridas response  Patient verbalized understanding  

## 2013-07-02 NOTE — Telephone Encounter (Signed)
Message copied by Fransico SettersBAUCOM, Leng Montesdeoca E on Tue Jul 02, 2013  9:08 AM ------      Message from: Lorine BearsARIDA, MUHAMMAD A      Created: Tue Jul 02, 2013  8:55 AM       The patient inquired about genetic testing for hypertrophic cardiomyopathy. I checked on this.       We offer this testing. Given that he already has a diagnosis of hypertrophic cardiomyopathy, most of the time the test is not covered by insurance.       My understanding is that the initial test costs $2500 to $3,000. Once there is a known gene change, the test can be offered to other family members (parents, siblings, children, etc.) for $250- $350.       He should check with his insurance company and let us know if he wants to proceed.              ------

## 2014-07-21 ENCOUNTER — Telehealth: Payer: Self-pay | Admitting: Cardiovascular Disease

## 2014-07-21 MED ORDER — METOPROLOL SUCCINATE ER 50 MG PO TB24: 50.0000 mg | ORAL_TABLET | Freq: Every day | ORAL | Status: DC

## 2014-07-21 NOTE — Telephone Encounter (Signed)
°  1. Which medications need to be refilled? Metoprolol 50 mg once daily   2. Which pharmacy is medication to be sent to? [facility]   3. Do they need a 30 day or 90 day supply? 90  4. Would they like a call back once the medication has been sent to the pharmacy? No

## 2014-07-21 NOTE — Telephone Encounter (Signed)
Refill sent for metoprolol 50 90 day supply with 3 refill.

## 2014-12-01 ENCOUNTER — Ambulatory Visit (INDEPENDENT_AMBULATORY_CARE_PROVIDER_SITE_OTHER): Payer: BLUE CROSS/BLUE SHIELD | Admitting: Cardiovascular Disease

## 2014-12-01 ENCOUNTER — Encounter: Payer: Self-pay | Admitting: Cardiovascular Disease

## 2014-12-01 VITALS — BP 122/98 | HR 59 | Ht 71.0 in | Wt 243.8 lb

## 2014-12-01 DIAGNOSIS — I10 Essential (primary) hypertension: Secondary | ICD-10-CM

## 2014-12-01 DIAGNOSIS — I77819 Aortic ectasia, unspecified site: Secondary | ICD-10-CM | POA: Diagnosis not present

## 2014-12-01 DIAGNOSIS — I422 Other hypertrophic cardiomyopathy: Secondary | ICD-10-CM | POA: Diagnosis not present

## 2014-12-01 DIAGNOSIS — I429 Cardiomyopathy, unspecified: Secondary | ICD-10-CM | POA: Diagnosis not present

## 2014-12-01 NOTE — Assessment & Plan Note (Signed)
This will be evaluated by echocardiogram.

## 2014-12-01 NOTE — Progress Notes (Signed)
HPI  This is a 44 year old male  with history of hypertrophic cardiomyopathy who is here today for a followup visit. Most recent echo in December 2012 showed stable significant asymmetric hypertrophy of the basal septum. No LVOT gradient or systolic anterior motion of the mitral valve. Previous cardiac MRI showed maximal thickness of the basal septum of 2.2 cm. Holter monitor in the past  showed occasional PVCs but no other arrhythmias. There was no evidence of atrial fibrillation or nonsustained ventricular tachycardia. Total PVC count was only 203. His symptoms correlated with PVCs. He was evaluated by Dr. Ladona Ridgel who felt that he was at low risk for malignant arrhythmia. He has been doing very well and denies any chest pain, dyspnea or palpitations.  No Known Allergies   Current Outpatient Prescriptions on File Prior to Visit  Medication Sig Dispense Refill  . metoprolol succinate (TOPROL-XL) 50 MG 24 hr tablet Take 1 tablet (50 mg total) by mouth daily. Take with or immediately following a meal. 90 tablet 3   No current facility-administered medications on file prior to visit.     Past Medical History  Diagnosis Date  . Hypertension     stage I  . Hypogonadism male   . Hyperlipidemia   . Hypertrophic cardiomyopathy      Past Surgical History  Procedure Laterality Date  . Appendectomy    . Tonsillectomy    . Hernia repair       Family History  Problem Relation Age of Onset  . Hypertension Father   . Heart attack Father   . Heart disease Maternal Grandmother   . Heart attack Maternal Grandfather   . Atrial fibrillation Mother   . Heart attack Paternal Grandfather     sudden cardiac death     Social History   Social History  . Marital Status: Married    Spouse Name: N/A  . Number of Children: N/A  . 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 Smoking status: Never Smoker   . Smokeless  tobacco: Never Used  . Alcohol Use: Yes  . Drug Use: No  . Sexual Activity: Not on file   Other Topics Concern  . Not on file   Social History Narrative       PHYSICAL EXAM   BP 122/98 mmHg  Pulse 59  Ht  (1.803 m)  Wt 243 lb 12 oz (110.564 kg)  BMI 34.01 kg/m2 Constitutional: He is oriented to person, place, and time. He appears well-developed and well-nourished. No distress.  HENT: No nasal discharge.  Head: Normocephalic and atraumatic.  Eyes: Pupils are equal and round. Right eye exhibits no discharge. Left eye exhibits no discharge.  Neck: Normal range of motion. Neck supple. No JVD present. No thyromegaly present.  Cardiovascular: Normal rate, regular rhythm, normal heart sounds and. Exam reveals no gallop and no friction rub. No murmur heard.  Pulmonary/Chest: Effort normal and breath sounds normal. No stridor. No respiratory distress. He has no wheezes. He has no rales. He exhibits no tenderness.  Abdominal: Soft. Bowel sounds are normal. He exhibits no distension. There is no tenderness. There is no rebound and no guarding.  Musculoskeletal: Normal range of motion. He exhibits no edema and no tenderness.  Neurological: He is alert and oriented to person, place, and time. Coordination normal.  Skin: Skin is warm and dry. No rash noted. He is not diaphoretic. No  erythema. No pallor.  Psychiatric: He has a normal mood and affect. His behavior is normal. Judgment and thought content normal.     EKG:  Sinus  Bradycardia  -Poor R-wave progression -nonspecific -consider old anterior infarct.   BORDERLINE     ASSESSMENT AND PLAN

## 2014-12-01 NOTE — Assessment & Plan Note (Signed)
Blood pressure is well controlled on  Toprol. 

## 2014-12-01 NOTE — Assessment & Plan Note (Signed)
He is overall clinically stable but with no recent echocardiographic evaluation since 2012. I requested an echocardiogram.

## 2014-12-01 NOTE — Patient Instructions (Signed)
Medication Instructions:  Your physician recommends that you continue on your current medications as directed. Please refer to the Current Medication list given to you today.   Labwork: none  Testing/Procedures: Your physician has requested that you have an echocardiogram. Echocardiography is a painless test that uses sound waves to create images of your heart. It provides your doctor with information about the size and shape of your heart and how well your heart's chambers and valves are working. This procedure takes approximately one hour. There are no restrictions for this procedure.    Follow-Up: Your physician wants you to follow-up in: one year with Dr. Arida. You will receive a reminder letter in the mail two months in advance. If you don't receive a letter, please call our office to schedule the follow-up appointment.   Any Other Special Instructions Will Be Listed Below (If Applicable).  Echocardiogram An echocardiogram, or echocardiography, uses sound waves (ultrasound) to produce an image of your heart. The echocardiogram is simple, painless, obtained within a short period of time, and offers valuable information to your health care provider. The images from an echocardiogram can provide information such as:  Evidence of coronary artery disease (CAD).  Heart size.  Heart muscle function.  Heart valve function.  Aneurysm detection.  Evidence of a past heart attack.  Fluid buildup around the heart.  Heart muscle thickening.  Assess heart valve function. LET YOUR HEALTH CARE PROVIDER KNOW ABOUT:  Any allergies you have.  All medicines you are taking, including vitamins, herbs, eye drops, creams, and over-the-counter medicines.  Previous problems you or members of your family have had with the use of anesthetics.  Any blood disorders you have.  Previous surgeries you have had.  Medical conditions you have.  Possibility of pregnancy, if this applies. BEFORE  THE PROCEDURE  No special preparation is needed. Eat and drink normally.  PROCEDURE   In order to produce an image of your heart, gel will be applied to your chest and a wand-like tool (transducer) will be moved over your chest. The gel will help transmit the sound waves from the transducer. The sound waves will harmlessly bounce off your heart to allow the heart images to be captured in real-time motion. These images will then be recorded.  You may need an IV to receive a medicine that improves the quality of the pictures. AFTER THE PROCEDURE You may return to your normal schedule including diet, activities, and medicines, unless your health care provider tells you otherwise. Document Released: 02/26/2000 Document Revised: 07/15/2013 Document Reviewed: 11/05/2012 ExitCare Patient Information 2015 [employer]. This information is not intended to replace advice given to you by your health care provider. Make sure you discuss any questions you have with your health care provider.  

## 2014-12-05 ENCOUNTER — Ambulatory Visit (INDEPENDENT_AMBULATORY_CARE_PROVIDER_SITE_OTHER): Payer: BLUE CROSS/BLUE SHIELD

## 2014-12-05 ENCOUNTER — Other Ambulatory Visit: Payer: Self-pay

## 2014-12-05 DIAGNOSIS — I429 Cardiomyopathy, unspecified: Secondary | ICD-10-CM

## 2015-05-18 ENCOUNTER — Ambulatory Visit (INDEPENDENT_AMBULATORY_CARE_PROVIDER_SITE_OTHER): Payer: 59 | Admitting: Family Medicine

## 2015-05-18 ENCOUNTER — Encounter: Payer: Self-pay | Admitting: Family Medicine

## 2015-05-18 VITALS — BP 134/78 | HR 54 | Temp 97.4°F | Wt 254.2 lb

## 2015-05-18 DIAGNOSIS — H0014 Chalazion left upper eyelid: Secondary | ICD-10-CM

## 2015-05-18 HISTORY — DX: Chalazion left upper eyelid: H00.14

## 2015-05-18 NOTE — Patient Instructions (Signed)
Chalazion A chalazion is a swelling or lump on the eyelid. It can affect the upper or lower eyelid. CAUSES This condition may be caused by:  Long-lasting (chronic) inflammation of the eyelid glands.  A blocked oil gland in the eyelid. SYMPTOMS Symptoms of this condition include:  A swelling on the eyelid. The swelling may spread to areas around the eye.  A hard lump on the eyelid. This lump may make it hard to see out of the eye. DIAGNOSIS This condition is diagnosed with an examination of the eye. TREATMENT This condition is treated by applying a warm compress to the eyelid. If the condition does not improve after two days, it may be treated with:  Surgery.  Medicine that is injected into the chalazion by a health care provider. HOME CARE INSTRUCTIONS  Do not touch the chalazion.  Do not try to remove the pus, such as by squeezing the chalazion or sticking it with a pin or needle.  Do not rub your eyes.  Wash your hands often. Dry your hands with a clean towel.  Keep your face, scalp, and eyebrows clean.  Apply a warm, moist compress to the eyelid 4-6 times a day for 10-15 minutes at a time. This will help to open any blocked glands and help to reduce redness and swelling.  Apply over-the-counter and prescription medicines only as told by your health care provider.  If the chalazion does not break open (rupture) on its own in a month, return to your health care provider.  Keep all follow-up appointments as told by your health care provider. This is important. SEEK MEDICAL CARE IF:  Your eyelid has not improved in 4 weeks.  Your eyelid is getting worse.  You have a fever.  The chalazion does not rupture on its own with home treatment in a month. SEEK IMMEDIATE MEDICAL CARE IF:  You have pain in your eye.  Your vision changes.  The chalazion becomes painful or red  The chalazion gets bigger.   This information is not intended to replace advice given to you  by your health care provider. Make sure you discuss any questions you have with your health care provider.   Document Released: 02/26/2000 Document Revised: 11/19/2014 Document Reviewed: 06/23/2014 Elsevier Interactive Patient Education [employer]2016 Elsevier Inc.

## 2015-05-18 NOTE — Progress Notes (Signed)
Pre visit review using our clinic review tool, if applicable. No additional management support is needed unless otherwise documented below in the visit note. 

## 2015-05-18 NOTE — Progress Notes (Signed)
   Subjective:   Patient ID: Tony Murphy, male    DOB: Nov 05, 1970, 45 y.o.   MRN: [id number]  Tony LocketJeffery Prisk is a pleasant 45 y.o. year old male who presents to clinic today with eye lid lesion  on 05/18/2015  HPI:  Left upper eye lid lesions- has been there for a month or two. Not red, warm or painful.  Has not impacted his vision at all and it is "not bothering" him.  Has not tried anything for it. He does not think it is growing in size but it is not getting smaller.  Current Outpatient Prescriptions on File Prior to Visit  Medication Sig Dispense Refill  . metoprolol succinate (TOPROL-XL) 50 MG 24 hr tablet Take 1 tablet (50 mg total) by mouth daily. Take with or immediately following a meal. 90 tablet 3   No current facility-administered medications on file prior to visit.    No Known Allergies  Past Medical History  Diagnosis Date  . Hypertension     stage I  . Hypogonadism male   . Hyperlipidemia   . Hypertrophic cardiomyopathy [facility](HCC)     Past Surgical History  Procedure Laterality Date  . Appendectomy    . Tonsillectomy    . Hernia repair      Family History  Problem Relation Age of Onset  . Hypertension Father   . Heart attack Father   . Heart disease Maternal Grandmother   . Heart attack Maternal Grandfather   . Atrial fibrillation Mother   . Heart attack Paternal Grandfather     sudden cardiac death    Social History   Social History  . Marital Status: Married    Spouse Name: N/A  . Number of Children: N/A  . 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 Smoking status: Never Smoker   . Smokeless tobacco: Never Used  . Alcohol Use: Yes  . Drug Use: No  . Sexual Activity: Not on file   Other Topics Concern  . Not on file   Social History Narrative   The PMH, PSH, Social History, Family History, Medications, and allergies [facility], and have been updated  if relevant.  Review of Systems  Eyes: Negative.   All other systems reviewed and are negative.      Objective:    BP 134/78 mmHg  Pulse 54  Temp(Src) 97.4 F (36.3 C) (Oral)  Wt 254 lb 4 oz (115.327 kg)  SpO2 98%   Physical Exam  Constitutional: He is oriented to person, place, and time. He appears well-developed and well-nourished. No distress.  Eyes:    Cardiovascular: Normal rate.   Pulmonary/Chest: Effort normal.  Neurological: He is alert and oriented to person, place, and time.  Skin: Skin is warm and dry. He is not diaphoretic.  Psychiatric: He has a normal mood and affect. His behavior is normal. Judgment and thought content normal.  Nursing note and vitals reviewed.         Assessment & Plan:   Chalazion of left upper eyelid No Follow-up on file.

## 2015-05-18 NOTE — Assessment & Plan Note (Signed)
New- discussed course and tx of chalazion. He would like to try warm compresses first prior to being referred to optho for either excision or steroid injections. The patient indicates understanding of these issues and agrees with the plan. Call or return to clinic prn if these symptoms worsen or fail to improve as anticipated.

## 2015-07-05 ENCOUNTER — Other Ambulatory Visit: Payer: Self-pay | Admitting: Cardiovascular Disease

## 2016-01-25 ENCOUNTER — Ambulatory Visit: Payer: 59 | Admitting: Cardiovascular Disease

## 2016-01-28 ENCOUNTER — Telehealth: Payer: Self-pay | Admitting: Cardiovascular Disease

## 2016-01-28 NOTE — Telephone Encounter (Signed)
Received records request [employer] , forwarded to [employer] for processing ° °

## 2016-01-29 NOTE — Telephone Encounter (Signed)
[employer] Checking on Status of Questionnaire.  Given Contact for [employer].

## 2016-02-02 ENCOUNTER — Telehealth: Payer: Self-pay | Admitting: Cardiovascular Disease

## 2016-02-02 NOTE — Telephone Encounter (Signed)
Received records request [employer] , forwarded to [employer] for processing ° °

## 2016-02-15 ENCOUNTER — Telehealth: Payer: Self-pay | Admitting: Cardiovascular Disease

## 2016-02-15 NOTE — Telephone Encounter (Signed)
Received fax from [employer] requesting completion of questionnaire for life insurance. Paperwork placed in Dr. Jari SportsmanArida's in basket

## 2016-02-17 NOTE — Telephone Encounter (Signed)
[employer] paperwork at scheduling desk with Martie LeeSabrina

## 2016-02-17 NOTE — Telephone Encounter (Signed)
Left message for pt to come by and sign release form for Dr. Kirke CorinArida to complete [facility].

## 2016-02-18 ENCOUNTER — Ambulatory Visit (INDEPENDENT_AMBULATORY_CARE_PROVIDER_SITE_OTHER): Payer: 59 | Admitting: Cardiovascular Disease

## 2016-02-18 ENCOUNTER — Encounter: Payer: Self-pay | Admitting: Cardiovascular Disease

## 2016-02-18 VITALS — BP 144/10 | HR 62 | Ht 72.0 in | Wt 253.8 lb

## 2016-02-18 DIAGNOSIS — I422 Other hypertrophic cardiomyopathy: Secondary | ICD-10-CM

## 2016-02-18 DIAGNOSIS — I10 Essential (primary) hypertension: Secondary | ICD-10-CM

## 2016-02-18 MED ORDER — AMLODIPINE BESYLATE 5 MG PO TABS: 5.0000 mg | ORAL_TABLET | Freq: Every day | ORAL | 3 refills | Status: DC

## 2016-02-18 MED ORDER — AMLODIPINE BESYLATE 5 MG PO TABS: 5.0000 mg | ORAL_TABLET | Freq: Every day | ORAL | 1 refills | Status: DC

## 2016-02-18 NOTE — Patient Instructions (Addendum)
Medication Instructions: - Your physician has recommended you make the following change in your medication:  1) Start norvasc (amlodipine) 5 mg- take one tablet by mouth once daily  Labwork: - none ordered  Procedures/Testing: - none ordered  Follow-Up: - Your physician wants you to follow-up in: 1 year with Dr. Kirke CorinArida. You will receive a reminder letter in the mail two months in advance. If you don't receive a letter, please call our office to schedule the follow-up appointment.  Any Additional Special Instructions Will Be Listed Below (If Applicable).     If you need a refill on your cardiac medications before your next appointment, please call your pharmacy.

## 2016-02-18 NOTE — Telephone Encounter (Signed)
Pt signed authorization on 12/6. Placed in MD basket

## 2016-02-18 NOTE — Progress Notes (Signed)
Cardiology Office Note   Date:  02/18/2016   ID:  Tony Murphy, DOB 02/09/1971, MRN [id number]  PCP:  Ruthe Mannanalia Aron, MD  Cardiologist:   Lorine BearsMuhammad Toula Miyasaki, MD   Chief Complaint  Patient presents with  . other    37mo f/u. Pt states he is doing well. Reviewed meds with pt verbally.      History of Present Illness: Tony Murphy is a 45 y.o. male who presents for a follow up visit regarding hypertrophic cardiomyopathy. Most recent echo in September 2016 showed stable significant asymmetric hypertrophy of the basal septum. No LVOT gradient or systolic anterior motion of the mitral valve. Previous cardiac MRI showed maximal thickness of the basal septum of 2.2 cm.  aortic root was 37 mm and ascending aorta was 41 mm Holter monitor in the past  showed occasional PVCs but no other arrhythmias. There was no evidence of atrial fibrillation or nonsustained ventricular tachycardia. Total PVC count was only 203. His symptoms correlated with PVCs. He was evaluated by Dr. Ladona Ridgelaylor who felt that he was at low risk for malignant arrhythmia. He has been doing very well and denies any chest pain, dyspnea or palpitations.  He is less active than before and gained 10 pounds over the last year due to lack of exercise. His blood pressure also has been running high as he noted during a health screening recently. He is hypertensive today as well.    Past Medical History:  Diagnosis Date  . Hyperlipidemia   . Hypertension    stage I  . Hypertrophic cardiomyopathy (HCC)   . Hypogonadism male     Past Surgical History:  Procedure Laterality Date  . APPENDECTOMY    . HERNIA REPAIR    . TONSILLECTOMY       Current Outpatient Prescriptions  Medication Sig Dispense Refill  . metoprolol succinate (TOPROL-XL) 50 MG 24 hr tablet TAKE 1 TABLET DAILY. TAKE  WITH OR IMMEDIATELY        FOLLOWING A MEAL 90 tablet 3   No current facility-administered medications for this visit.     Allergies:   Patient  has no known allergies.    Social History:  The patient  reports that he has never smoked. He has never used smokeless tobacco. He reports that he drinks alcohol. He reports that he does not use drugs.   Family History:  The patient's family history includes Atrial fibrillation in his mother; Heart attack in his father, maternal grandfather, and paternal grandfather; Heart disease in his maternal grandmother; Hypertension in his father.    ROS:  Please see the history of present illness.   Otherwise, review of systems are positive for none.   All other systems are reviewed and negative.    PHYSICAL EXAM: VS:  BP (!) 144/10 (BP Location: Left Arm, Patient Position: Sitting, Cuff Size: Normal)   Pulse 62   Ht 6' (1.829 m)   Wt 253 lb 12 oz (115.1 kg)   BMI 34.41 kg/m  , BMI Body mass index is 34.41 kg/m. GEN: Well nourished, well developed, in no acute distress  HEENT: normal  Neck: no JVD, carotid bruits, or masses Cardiac: RRR; no murmurs, rubs, or gallops,no edema  Respiratory:  clear to auscultation bilaterally, normal work of breathing GI: soft, nontender, nondistended, + BS MS: no deformity or atrophy  Skin: warm and dry, no rash Neuro:  Strength and sensation are intact Psych: euthymic mood, full affect   EKG:  EKG is ordered today.  The ekg ordered today demonstrates normal sinus rhythm with poor R-wave progression in the anterior leads.   Recent Labs: No results found for requested labs within last 8760 hours.    Lipid Panel    Component Value Date/Time   CHOL 182 08/17/2009 1021   TRIG 74.0 08/17/2009 1021   HDL 39.80 08/17/2009 1021   CHOLHDL 5 08/17/2009 1021   VLDL 14.8 08/17/2009 1021   LDLCALC 127 (H) 08/17/2009 1021      Wt Readings from Last 3 Encounters:  02/18/16 253 lb 12 oz (115.1 kg)  05/18/15 254 lb 4 oz (115.3 kg)  12/01/14 243 lb 12 oz (110.6 kg)        No flowsheet data found.    ASSESSMENT AND PLAN:  1.  Hypertrophic  cardiomyopathy: Completely asymptomatic with no high risk features for malignant arrhythmia or sudden cardiac death.  2. Mildly dilated ascending aorta: we can follow this with serial echoes yearly or every other year.  3. Essential hypertension: Blood pressure has been elevated which could be due to his decreased physical activity and weight gain. I elected to add amlodipine 5 mg once daily.   Disposition:   FU with me in 1 year  Signed,  Lorine BearsMuhammad Charlette Hennings, MD  02/18/2016 12:08 PM    [facility]

## 2016-03-01 ENCOUNTER — Telehealth: Payer: Self-pay | Admitting: Cardiovascular Disease

## 2016-03-01 NOTE — Telephone Encounter (Signed)
[facility]

## 2016-03-01 NOTE — Telephone Encounter (Signed)
[employer] Insurance paperwork completed and returned to [place]

## 2016-04-13 ENCOUNTER — Ambulatory Visit (INDEPENDENT_AMBULATORY_CARE_PROVIDER_SITE_OTHER): Payer: Managed Care, Other (non HMO) | Admitting: Family Medicine

## 2016-04-13 ENCOUNTER — Encounter: Payer: Self-pay | Admitting: Family Medicine

## 2016-04-13 VITALS — BP 120/80 | HR 76 | Temp 98.7°F | Wt 246.0 lb

## 2016-04-13 DIAGNOSIS — J111 Influenza due to unidentified influenza virus with other respiratory manifestations: Secondary | ICD-10-CM | POA: Diagnosis not present

## 2016-04-13 HISTORY — DX: Influenza due to unidentified influenza virus with other respiratory manifestations: J11.1

## 2016-04-13 NOTE — Patient Instructions (Addendum)
Presumed flu, improving.  Avoid "-D" med due to high blood pressure.  "-DM" meds should be okay.  I would get a flu shot each fall.   Get one now when you feel better.  If you have to, cut amlodipine in half or skip a dose if lightheaded.  Rest and fluids.   Take care.  Glad to see you.

## 2016-04-13 NOTE — Progress Notes (Signed)
Sx started about 5 days ago.  First noted fever.   Was lightheaded on standing.  Then had cough, can be dry vs productive.  Some sinus pain.  Some diarrhea.  Still all sx ongoing, had fever last night, but sx are less severe now.  He is getting some better, overall.    H/o hypertrophic cardiomyopathy: per cardiology he is completely asymptomatic with no high risk features for malignant arrhythmia or sudden cardiac death.  Meds, vitals, and allergies reviewed.   ROS: Per HPI unless specifically indicated in ROS section   GEN: nad, alert and oriented HEENT: mucous membranes moist, tm w/o erythema, nasal exam w/o erythema, clear discharge noted,  OP with cobblestoning NECK: supple w/o LA CV: rrr.   PULM: ctab, no inc wob EXT: no edema

## 2016-04-13 NOTE — Assessment & Plan Note (Signed)
Presumed flu, d/w pt.  Nontoxic.  No specific intervention needed now other than supportive care.  D/w pt.  See AVS.  He agrees. Update us as needed.

## 2016-08-08 ENCOUNTER — Other Ambulatory Visit: Payer: Self-pay | Admitting: Cardiovascular Disease

## 2017-03-28 ENCOUNTER — Other Ambulatory Visit: Payer: Self-pay | Admitting: Cardiovascular Disease

## 2017-03-28 NOTE — Telephone Encounter (Signed)
Pt needs f/u appt with Arida for refills. Thanks 

## 2017-04-05 ENCOUNTER — Telehealth: Payer: Self-pay | Admitting: Cardiovascular Disease

## 2017-04-05 NOTE — Telephone Encounter (Signed)
Left msg for pt to schedule past due appt in order to get refills.

## 2017-04-12 ENCOUNTER — Other Ambulatory Visit: Payer: Self-pay

## 2017-04-12 MED ORDER — AMLODIPINE BESYLATE 5 MG PO TABS: 5.0000 mg | ORAL_TABLET | Freq: Every day | ORAL | 1 refills | Status: DC

## 2017-06-08 ENCOUNTER — Ambulatory Visit (INDEPENDENT_AMBULATORY_CARE_PROVIDER_SITE_OTHER): Payer: 59 | Admitting: Cardiovascular Disease

## 2017-06-08 ENCOUNTER — Encounter: Payer: Self-pay | Admitting: Cardiovascular Disease

## 2017-06-08 VITALS — BP 124/70 | HR 38 | Ht 70.0 in | Wt 221.0 lb

## 2017-06-08 DIAGNOSIS — I7121 Aneurysm of the ascending aorta, without rupture: Secondary | ICD-10-CM

## 2017-06-08 DIAGNOSIS — I10 Essential (primary) hypertension: Secondary | ICD-10-CM

## 2017-06-08 DIAGNOSIS — I422 Other hypertrophic cardiomyopathy: Secondary | ICD-10-CM

## 2017-06-08 DIAGNOSIS — I712 Thoracic aortic aneurysm, without rupture: Secondary | ICD-10-CM | POA: Diagnosis not present

## 2017-06-08 MED ORDER — METOPROLOL SUCCINATE ER 25 MG PO TB24: 25.0000 mg | ORAL_TABLET | Freq: Every day | ORAL | 3 refills | Status: DC

## 2017-06-08 NOTE — Patient Instructions (Addendum)
Medication Instructions:  Your physician has recommended you make the following change in your medication:  DECREASE metoprolol to 25mg  once daily    Labwork: none  Testing/Procedures: Your physician has requested that you have an echocardiogram. Echocardiography is a painless test that uses sound waves to create images of your heart. It provides your doctor with information about the size and shape of your heart and how well your heart's chambers and valves are working. This procedure takes approximately one hour. There are no restrictions for this procedure.    Follow-Up: Your physician wants you to follow-up in: 1 year with Dr. Kirke Murphy.  You will receive a reminder letter in the mail two months in advance. If you don't receive a letter, please call our office to schedule the follow-up appointment.   Any Other Special Instructions Will Be Listed Below (If Applicable).     If you need a refill on your cardiac medications before your next appointment, please call your pharmacy.  Echocardiogram An echocardiogram, or echocardiography, uses sound waves (ultrasound) to produce an image of your heart. The echocardiogram is simple, painless, obtained within a short period of time, and offers valuable information to your health care provider. The images from an echocardiogram can provide information such as:  Evidence of coronary artery disease (CAD).  Heart size.  Heart muscle function.  Heart valve function.  Aneurysm detection.  Evidence of a past heart attack.  Fluid buildup around the heart.  Heart muscle thickening.  Assess heart valve function.  Tell a health care provider about:  Any allergies you have.  All medicines you are taking, including vitamins, herbs, eye drops, creams, and over-the-counter medicines.  Any problems you or family members have had with anesthetic medicines.  Any blood disorders you have.  Any surgeries you have had.  Any medical  conditions you have.  Whether you are pregnant or may be pregnant. What happens before the procedure? No special preparation is needed. Eat and drink normally. What happens during the procedure?  In order to produce an image of your heart, gel will be applied to your chest and a wand-like tool (transducer) will be moved over your chest. The gel will help transmit the sound waves from the transducer. The sound waves will harmlessly bounce off your heart to allow the heart images to be captured in real-time motion. These images will then be recorded.  You may need an IV to receive a medicine that improves the quality of the pictures. What happens after the procedure? You may return to your normal schedule including diet, activities, and medicines, unless your health care provider tells you otherwise. This information is not intended to replace advice given to you by your health care provider. Make sure you discuss any questions you have with your health care provider. Document Released: 02/26/2000 Document Revised: 10/17/2015 Document Reviewed: 11/05/2012 Elsevier Interactive Patient Education  2017 [employer]

## 2017-06-08 NOTE — Progress Notes (Signed)
Cardiology Office Note   Date:  06/08/2017   ID:  Tony LocketJeffery Koo, DOB March 01, 1971, MRN [id number]  PCP:  [name], [name], MD  Cardiologist:   Lorine BearsMuhammad Arida, MD   Chief Complaint  Patient presents with  . Other    12 month follow up. patient denies chest pain and SOB at this time. Meds reviewed vebally with patient.       History of Present Illness: Tony Murphy is a 47 y.o. male who presents for a follow up visit regarding hypertrophic cardiomyopathy. Most recent echo in September 2016 showed stable significant asymmetric hypertrophy of the basal septum. No LVOT gradient or systolic anterior motion of the mitral valve. Previous cardiac MRI showed maximal thickness of the basal septum of 2.2 cm.  aortic root was 37 mm and ascending aorta was 41 mm Holter monitor in the past  showed occasional PVCs but no other arrhythmias. There was no evidence of atrial fibrillation or nonsustained ventricular tachycardia. Total PVC count was only 203. His symptoms correlated with PVCs. He was evaluated by Dr. Ladona Ridgelaylor who felt that he was at low risk for malignant arrhythmia.  He has been doing extremely well with no chest pain, shortness of breath, palpitations or dizziness.  As a matter fact he started an exercise program over the last year and improved his diet.  He managed to lose about 30 pounds and feels extremely well.  He is noted to be bradycardic today but he has no symptoms related to this.  Past Medical History:  Diagnosis Date  . Hyperlipidemia   . Hypertension    stage I  . Hypertrophic cardiomyopathy (HCC)   . Hypogonadism male     Past Surgical History:  Procedure Laterality Date  . APPENDECTOMY    . HERNIA REPAIR    . TONSILLECTOMY       Current Outpatient Medications  Medication Sig Dispense Refill  . amLODipine (NORVASC) 5 MG tablet Take 1 tablet (5 mg total) by mouth daily. 30 tablet 1  . metoprolol succinate (TOPROL-XL) 50 MG 24 hr tablet TAKE 1 TABLET DAILY. TAKE   WITH OR IMMEDIATELY        FOLLOWING A MEAL 90 tablet 3   No current facility-administered medications for this visit.     Allergies:   Patient has no known allergies.    Social History:  The patient  reports that he has never smoked. He has never used smokeless tobacco. He reports that he drinks alcohol. He reports that he does not use drugs.   Family History:  The patient's family history includes Atrial fibrillation in his mother; Heart attack in his father, maternal grandfather, and paternal grandfather; Heart disease in his maternal grandmother; Hypertension in his father.    ROS:  Please see the history of present illness.   Otherwise, review of systems are positive for none.   All other systems are reviewed and negative.    PHYSICAL EXAM: VS:  BP 124/70 (BP Location: Left Arm, Patient Position: Sitting, Cuff Size: Normal)   Pulse (!) 38   Ht 5\' 10"  (1.778 m)   Wt 221 lb (100.2 kg)   BMI 31.71 kg/m  , BMI Body mass index is 31.71 kg/m. GEN: Well nourished, well developed, in no acute distress  HEENT: normal  Neck: no JVD, carotid bruits, or masses Cardiac: RRR; no murmurs, rubs, or gallops,no edema  Respiratory:  clear to auscultation bilaterally, normal work of breathing GI: soft, nontender, nondistended, + BS MS: no  deformity or atrophy  Skin: warm and dry, no rash Neuro:  Strength and sensation are intact Psych: euthymic mood, full affect   EKG:  EKG is ordered today. The ekg ordered today demonstrates sinus bradycardia with a heart rate of 38 bpm.   Recent Labs: No results found for requested labs within last 8760 hours.    Lipid Panel    Component Value Date/Time   CHOL 182 08/17/2009 1021   TRIG 74.0 08/17/2009 1021   HDL 39.80 08/17/2009 1021   CHOLHDL 5 08/17/2009 1021   VLDL 14.8 08/17/2009 1021   LDLCALC 127 (H) 08/17/2009 1021      Wt Readings from Last 3 Encounters:  06/08/17 221 lb (100.2 kg)  04/13/16 246 lb (111.6 kg)  02/18/16 253 lb  12 oz (115.1 kg)        No flowsheet data found.    ASSESSMENT AND PLAN:  1.  Hypertrophic cardiomyopathy: Completely asymptomatic with no high risk features for malignant arrhythmia or sudden cardiac death.  I requested a follow-up echocardiogram for this and also to check his mildly dilated a sending aorta.  2. Mildly dilated ascending aorta: A repeat echocardiogram was requested.  His aortic root and ascending aorta were well visualized by echo and thus we will hold off on CT or MRI for now.  3. Essential hypertension: Blood pressure improved to normal with the addition of amlodipine last year.  He is bradycardic but appears to be asymptomatic.  I elected to decrease the dose of Toprol to 25 mg once daily.   Disposition:   FU with me in 1 year  Signed,  Lorine Bears, MD  06/08/2017 8:33 AM     [facility]

## 2017-06-17 ENCOUNTER — Other Ambulatory Visit: Payer: Self-pay | Admitting: Cardiovascular Disease

## 2017-06-29 ENCOUNTER — Other Ambulatory Visit: Payer: Self-pay

## 2017-06-29 ENCOUNTER — Ambulatory Visit (INDEPENDENT_AMBULATORY_CARE_PROVIDER_SITE_OTHER): Payer: 59

## 2017-06-29 DIAGNOSIS — I422 Other hypertrophic cardiomyopathy: Secondary | ICD-10-CM

## 2017-07-03 ENCOUNTER — Encounter: Payer: Self-pay | Admitting: Cardiovascular Disease

## 2017-10-21 ENCOUNTER — Other Ambulatory Visit: Payer: Self-pay | Admitting: Cardiovascular Disease

## 2017-10-25 ENCOUNTER — Telehealth: Payer: Self-pay

## 2017-10-25 NOTE — Telephone Encounter (Signed)
Error

## 2017-10-25 NOTE — Telephone Encounter (Signed)
Recent refill

## 2018-02-24 ENCOUNTER — Other Ambulatory Visit: Payer: Self-pay | Admitting: Cardiovascular Disease

## 2018-03-01 ENCOUNTER — Telehealth: Payer: Self-pay | Admitting: Cardiovascular Disease

## 2018-03-01 NOTE — Telephone Encounter (Signed)
I spoke with pharmacist and confirmed Refill request ok to be refilled as 90 day w/no refills. Pt is due in 3/19 and needs to contact us for future appointment in 3/19.

## 2018-03-01 NOTE — Telephone Encounter (Signed)
[facility] calling  States that they will need a confirmation for amlodipine  Please call [phone]  Ref # [id number]

## 2018-03-10 ENCOUNTER — Other Ambulatory Visit: Payer: Self-pay | Admitting: Cardiovascular Disease

## 2018-06-09 ENCOUNTER — Other Ambulatory Visit: Payer: Self-pay | Admitting: Cardiovascular Disease

## 2018-08-29 ENCOUNTER — Telehealth: Payer: Self-pay

## 2018-08-29 NOTE — Telephone Encounter (Signed)
Virtual Visit Pre-Appointment Phone Call  "Tony Murphy, I am calling you today to discuss your upcoming appointment. We are currently trying to limit exposure to the virus that causes COVID-19 by seeing patients at home rather than in the office."  1. "What is the BEST phone number to call the day of the visit?" - include this in appointment notes  2. "Do you have or have access to (through a family member/friend) a smartphone with video capability that we can use for your visit?" a. If yes - list this number in appt notes as "cell" (if different from BEST phone #) and list the appointment type as a VIDEO visit in appointment notes b. If no - list the appointment type as a PHONE visit in appointment notes  3. Confirm consent - "In the setting of the current Covid19 crisis, you are scheduled for a video visit with your provider on 09/11/2018 at 4:00PM.  Just as we do with many in-office visits, in order for you to participate in this visit, we must obtain consent.  If you'd like, I can send this to your mychart (if signed up) or email for you to review.  Otherwise, I can obtain your verbal consent now.  All virtual visits are billed to your insurance company just like a normal visit would be.  By agreeing to a virtual visit, we'd like you to understand that the technology does not allow for your provider to perform an examination, and thus may limit your provider's ability to fully assess your condition. If your provider identifies any concerns that need to be evaluated in person, we will make arrangements to do so.  Finally, though the technology is pretty good, we cannot assure that it will always work on either your or our end, and in the setting of a video visit, we may have to convert it to a phone-only visit.  In either situation, we cannot ensure that we have a secure connection.  Are you willing to proceed?" STAFF: Did the patient verbally acknowledge consent to telehealth visit? Document YES/NO  here: YES  4. Advise patient to be prepared - "Two hours prior to your appointment, go ahead and check your blood pressure, pulse, oxygen saturation, and your weight (if you have the equipment to check those) and write them all down. When your visit starts, your provider will ask you for this information. If you have an Apple Watch or Kardia device, please plan to have heart rate information ready on the day of your appointment. Please have a pen and paper handy nearby the day of the visit as well."  5. Give patient instructions for MyChart download to smartphone OR Doximity/Doxy.me as below if video visit (depending on what platform provider is using)  6. Inform patient they will receive a phone call 15 minutes prior to their appointment time (may be from unknown caller ID) so they should be prepared to answer    TELEPHONE CALL NOTE  Tony Murphy has been deemed a candidate for a follow-up tele-health visit to limit community exposure during the Covid-19 pandemic. I spoke with the patient via phone to ensure availability of phone/video source, confirm preferred email & phone number, and discuss instructions and expectations.  I reminded Tony Murphy to be prepared with any vital sign and/or heart rhythm information that could potentially be obtained via home monitoring, at the time of his visit. I reminded Tony Murphy to expect a phone call prior to his visit.  Tony Murphy L  Thayer Headingsewcomer Murphy 08/29/2018 11:30 AM   INSTRUCTIONS FOR DOWNLOADING THE MYCHART APP TO SMARTPHONE  - The patient must first make sure to have activated MyChart and know their login information - If Apple, go to [employer] Store and type in MyChart in the search bar and download the app. If Android, ask patient to go to [employer] Play Store and type in [facility] in the search bar and download the app. The app is free but as with any other app downloads, their phone may require them to verify saved payment information or  Apple/Android password.  - The patient will need to then log into the app with their MyChart username and password, and select [place] as their healthcare provider to link the account. When it is time for your visit, go to the MyChart app, find appointments, and click Begin Video Visit. Be sure to Select Allow for your device to access the Microphone and Camera for your visit. You will then be connected, and your provider will be with you shortly.  **If they have any issues connecting, or need assistance please contact MyChart service desk [phone])**  **If using a computer, in order to ensure the best quality for their visit they will need to use either of the following Internet Browsers: [employer] Edge, or Google Chrome**  IF USING DOXIMITY or DOXY.ME - The patient will receive a link just prior to their visit by text.     FULL LENGTH CONSENT FOR TELE-HEALTH VISIT   I hereby voluntarily request, consent and authorize [facility] and its employed or contracted physicians, physician assistants, nurse practitioners or other licensed health care professionals (the Practitioner), to provide me with telemedicine health care services (the "Services") as deemed necessary by the treating Practitioner. I acknowledge and consent to receive the Services by the Practitioner via telemedicine. I understand that the telemedicine visit will involve communicating with the Practitioner through live audiovisual communication technology and the disclosure of certain medical information by electronic transmission. I acknowledge that I have been given the opportunity to request an in-person assessment or other available alternative prior to the telemedicine visit and am voluntarily participating in the telemedicine visit.  I understand that I have the right to withhold or withdraw my consent to the use of telemedicine in the course of my care at any time, without affecting my right to future care  or treatment, and that the Practitioner or I may terminate the telemedicine visit at any time. I understand that I have the right to inspect all information obtained and/or recorded in the course of the telemedicine visit and may receive copies of available information for a reasonable fee.  I understand that some of the potential risks of receiving the Services via telemedicine include:  Marland Kitchen. Delay or interruption in medical evaluation due to technological equipment failure or disruption; . Information transmitted may not be sufficient (e.g. poor resolution of images) to allow for appropriate medical decision making by the Practitioner; and/or  . In rare instances, security protocols could fail, causing a breach of personal health information.  Furthermore, I acknowledge that it is my responsibility to provide information about my medical history, conditions and care that is complete and accurate to the best of my ability. I acknowledge that Practitioner's advice, recommendations, and/or decision may be based on factors not within their control, such as incomplete or inaccurate data provided by me or distortions of diagnostic images or specimens that may result from electronic transmissions. I understand that the practice  of medicine is not an Chief Strategy Officer and that Practitioner makes no warranties or guarantees regarding treatment outcomes. I acknowledge that I will receive a copy of this consent concurrently upon execution via email to the email address I last provided but may also request a printed copy by calling the office of [place].    I understand that my insurance will be billed for this visit.   I have read or had this consent read to me. . I understand the contents of this consent, which adequately explains the benefits and risks of the Services being provided via telemedicine.  . I have been provided ample opportunity to ask questions regarding this consent and the Services and have had  my questions answered to my satisfaction. . I give my informed consent for the services to be provided through the use of telemedicine in my medical care  By participating in this telemedicine visit I agree to the above.

## 2018-09-08 ENCOUNTER — Other Ambulatory Visit: Payer: Self-pay | Admitting: Cardiovascular Disease

## 2018-09-11 ENCOUNTER — Telehealth (INDEPENDENT_AMBULATORY_CARE_PROVIDER_SITE_OTHER): Payer: 59 | Admitting: Cardiovascular Disease

## 2018-09-11 ENCOUNTER — Other Ambulatory Visit: Payer: Self-pay

## 2018-09-11 ENCOUNTER — Encounter: Payer: Self-pay | Admitting: Cardiovascular Disease

## 2018-09-11 VITALS — BP 135/74 | HR 54 | Ht 71.0 in | Wt 238.0 lb

## 2018-09-11 DIAGNOSIS — I422 Other hypertrophic cardiomyopathy: Secondary | ICD-10-CM

## 2018-09-11 DIAGNOSIS — I712 Thoracic aortic aneurysm, without rupture: Secondary | ICD-10-CM

## 2018-09-11 DIAGNOSIS — I7121 Aneurysm of the ascending aorta, without rupture: Secondary | ICD-10-CM

## 2018-09-11 MED ORDER — CARVEDILOL 6.25 MG PO TABS: 6.2500 mg | ORAL_TABLET | Freq: Two times a day (BID) | ORAL | 3 refills | Status: DC

## 2018-09-11 MED ORDER — AMLODIPINE BESYLATE 5 MG PO TABS: 5.0000 mg | ORAL_TABLET | Freq: Every day | ORAL | 3 refills | Status: DC

## 2018-09-11 NOTE — Patient Instructions (Signed)
Medication Instructions:  Stop taking Toprol. Start carvedilol 6.25 mg twice daily If you need a refill on your cardiac medications before your next appointment, please call your pharmacy.   Lab work: None If you have labs (blood work) drawn today and your tests are completely normal, you will receive your results only by: Marland Kitchen MyChart Message (if you have MyChart) OR . A paper copy in the mail If you have any lab test that is abnormal or we need to change your treatment, we will call you to review the results.  Testing/Procedures: CTA of the chest to evaluate ascending aortic aneurysm  Follow-Up: At [facility], you and your health needs are our priority.  As part of our continuing mission to provide you with exceptional heart care, we have created designated Provider Care Teams.  These Care Teams include your primary Cardiologist (physician) and Advanced Practice Providers (APPs -  Physician Assistants and Nurse Practitioners) who all work together to provide you with the care you need, when you need it. You will need a follow up appointment in 6 months.  Please call our office 2 months in advance to schedule this appointment.  You may see Kathlyn Sacramento, MD or one of the following Advanced Practice Providers on your designated Care Team:   Murray Hodgkins, NP Christell Faith, PA-C . Marrianne Mood, PA-C

## 2018-09-11 NOTE — Addendum Note (Signed)
Addended by: Lamar Laundry on: 09/11/2018 04:47 PM   Modules accepted: Orders

## 2018-09-11 NOTE — Progress Notes (Signed)
Virtual Visit via Video Note   This visit type was conducted due to national recommendations for restrictions regarding the COVID-19 Pandemic (e.g. social distancing) in an effort to limit this patient's exposure and mitigate transmission in our community.  Due to his co-morbid illnesses, this patient is at least at moderate risk for complications without adequate follow up.  This format is felt to be most appropriate for this patient at this time.  All issues noted in this document were discussed and addressed.  A limited physical exam was performed with this format.  Please refer to the patient's chart for his consent to telehealth for [facility].   Date:  09/11/2018   ID:  Tony Murphy, DOB [birth year], MRN [id number]  Patient Location: Home Provider Location: Office  PCP:  Lucille Passy, MD  Cardiologist:  Kathlyn Sacramento, MD  Electrophysiologist:  None   Evaluation Performed:  Follow-Up Visit  Chief Complaint: No complaints today  History of Present Illness:    Tony Murphy is a 48 y.o. 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 Previous cardiac MRI showed maximal thickness of the basal septum of 2.2 cm.  aortic root was 37 mm and ascending aorta was 41 mm Holter monitor in the past  showed occasional PVCs but no other arrhythmias. There was no evidence of atrial fibrillation or nonsustained ventricular tachycardia. Total PVC count was only 203. His symptoms correlated with PVCs. He was evaluated by Dr. Lovena Le who felt that he was at low risk for malignant arrhythmia.   Most recent echocardiogram in April 2019 showed normal LV systolic function with moderate asymmetric of hypertrophy of the septum.  The aortic root was 4.2 cm and the ascending aorta was 4 cm.  There was mild mitral regurgitation.  Left atrium was moderately dilated.  He has been doing well and denies any chest pain,  shortness of breath or palpitations.  He gained weight since last year due to poor diet choices.  The patient does not have symptoms concerning for COVID-19 infection (fever, chills, cough, or new shortness of breath).    Past Medical History:  Diagnosis Date  . Hyperlipidemia   . Hypertension    stage I  . Hypertrophic cardiomyopathy ([place])   . Hypogonadism male    Past Surgical History:  Procedure Laterality Date  . APPENDECTOMY    . HERNIA REPAIR    . TONSILLECTOMY       Current Meds  Medication Sig  . amLODipine (NORVASC) 5 MG tablet TAKE 1 TABLET DAILY  . metoprolol succinate (TOPROL-XL) 25 MG 24 hr tablet TAKE 1 TABLET DAILY . TAKE               WITH OR IMMEDIATELY                      FOLLOWING A MEAL. MAKE AN APPOINTMENT FOR FURTHER REFILLS     Allergies:   Patient has no known allergies.   Social History   Tobacco Use  . Smoking status: Never Smoker  . Smokeless tobacco: Never Used  Substance Use Topics  . Alcohol use: Yes    Comment: socially  . Drug use: No     Family Hx: The patient's family history includes Atrial fibrillation in his mother; Heart attack in his father, maternal grandfather, and paternal grandfather; Heart disease in his maternal grandmother; Hypertension in his father.  ROS:   Please  see the history of present illness.     All other systems reviewed and are negative.   Prior CV studies:   The following studies were reviewed today:    Labs/Other Tests and Data Reviewed:    EKG:  No ECG reviewed.  Recent Labs: No results found for requested labs within last 8760 hours.   Recent Lipid Panel Lab Results  Component Value Date/Time   CHOL 182 08/17/2009 10:21 AM   TRIG 74.0 08/17/2009 10:21 AM   HDL 39.80 08/17/2009 10:21 AM   CHOLHDL 5 08/17/2009 10:21 AM   LDLCALC 127 (H) 08/17/2009 10:21 AM    Wt Readings from Last 3 Encounters:  09/11/18 238 lb (108 kg)  06/08/17 221 lb (100.2 kg)  04/13/16 246 lb (111.6 kg)      Objective:    Vital Signs:  BP 135/74   Pulse (!) 54   Ht 5\' 11"  (1.803 m)   Wt 238 lb (108 kg)   BMI 33.19 kg/m    VITAL SIGNS:  reviewed GEN:  no acute distress EYES:  sclerae anicteric, EOMI - Extraocular Movements Intact RESPIRATORY:  normal respiratory effort, symmetric expansion SKIN:  no rash, lesions or ulcers. MUSCULOSKELETAL:  no obvious deformities. NEURO:  alert and oriented x 3, no obvious focal deficit PSYCH:  normal affect  ASSESSMENT & PLAN:     1.  Hypertrophic cardiomyopathy: Completely asymptomatic with no high risk features for malignant arrhythmia or sudden cardiac death.  Echocardiogram in 2019 was stable  2. Mildly dilated ascending aorta:  Aortic root was 42 mm last year.  I am going to obtain CTA of the aorta for further evaluation.  3. Essential hypertension:  Blood pressure is mostly above 130 according to him and given the presence of mildly dilated ascending aorta, I think we have to be more aggressive with blood pressure control.  I elected to switch Toprol to carvedilol 6.25 mg twice daily.  COVID-19 Education: The signs and symptoms of COVID-19 were discussed with the patient and how to seek care for testing (follow up with PCP or arrange E-visit).  The importance of social distancing was discussed today.  Time:   Today, I have spent 8 minutes with the patient with telehealth technology discussing the above problems.     Medication Adjustments/Labs and Tests Ordered: Current medicines are reviewed at length with the patient today.  Concerns regarding medicines are outlined above.   Tests Ordered: No orders of the defined types were placed in this encounter.   Medication Changes: No orders of the defined types were placed in this encounter.   Follow Up:  In Person in 6 month(s)  Signed, Lorine BearsMuhammad Mackenize Delgadillo, MD  09/11/2018 4:22 PM    [facility]

## 2018-09-12 ENCOUNTER — Telehealth: Payer: Self-pay

## 2018-09-12 NOTE — Telephone Encounter (Signed)
Spoke with the pt. Adv him that he will need labwork (bmet) prior to his Chest CTA. Once scheduled pt will [facility] to [facility] medical mall a couple of days prior for labwork. Pt verbalized understanding.    Called central scheduling. Pt CTA is scheduled for 09/21/18 @ 11am. Pt will need to arrive @ 10:45am and report to [facility] radiology desk. Liquids only 4 hours prior to the test. Pt will need to call [phone] if he needs to reschedule.  lmtcb to discuss also needs COVID screening.

## 2018-09-14 ENCOUNTER — Other Ambulatory Visit
Admission: RE | Admit: 2018-09-14 | Discharge: 2018-09-14 | Disposition: A | Discharge status: Home / Self Care | Payer: 59 | Attending: Cardiovascular Disease | Admitting: Cardiovascular Disease

## 2018-09-14 DIAGNOSIS — I712 Thoracic aortic aneurysm, without rupture: Secondary | ICD-10-CM | POA: Insufficient documentation

## 2018-09-14 DIAGNOSIS — I7121 Aneurysm of the ascending aorta, without rupture: Secondary | ICD-10-CM

## 2018-09-14 LAB — BASIC METABOLIC PANEL
Anion gap: 8 (ref 5–15)
BUN: 11 mg/dL (ref 6–20)
CO2: 26 mmol/L (ref 22–32)
Calcium: 9.4 mg/dL (ref 8.9–10.3)
Chloride: 105 mmol/L (ref 98–111)
Creatinine, Ser: 0.9 mg/dL (ref 0.61–1.24)
GFR calc Af Amer: >60 mL/min (ref >60)
GFR calc non Af Amer: >60 mL/min (ref >60)
Glucose, Bld: 102 mg/dL — ABNORMAL HIGH (ref 70–99)
Potassium: 4.2 mmol/L (ref 3.5–5.1)
Sodium: 139 mmol/L (ref 135–145)

## 2018-09-14 NOTE — Addendum Note (Signed)
Addended by: Santiago Bur on: 09/14/2018 09:17 AM   Modules accepted: Orders

## 2018-09-21 ENCOUNTER — Other Ambulatory Visit: Payer: Self-pay

## 2018-09-21 ENCOUNTER — Ambulatory Visit
Admission: RE | Admit: 2018-09-21 | Discharge: 2018-09-21 | Disposition: A | Discharge status: Home / Self Care | Payer: 59 | Source: Ambulatory Visit | Attending: Cardiovascular Disease | Admitting: Cardiovascular Disease

## 2018-09-21 DIAGNOSIS — I7121 Aneurysm of the ascending aorta, without rupture: Secondary | ICD-10-CM

## 2018-09-21 DIAGNOSIS — I712 Thoracic aortic aneurysm, without rupture: Secondary | ICD-10-CM | POA: Diagnosis not present

## 2018-09-21 MED ORDER — IOHEXOL 350 MG/ML SOLN
75.0000 mL | Freq: Once | INTRAVENOUS | Status: AC | PRN
  Administered 2018-09-21: 75 mL via INTRAVENOUS

## 2018-10-18 ENCOUNTER — Other Ambulatory Visit: Payer: Self-pay

## 2018-10-18 DIAGNOSIS — I712 Thoracic aortic aneurysm, without rupture, unspecified: Secondary | ICD-10-CM

## 2018-10-25 ENCOUNTER — Other Ambulatory Visit: Payer: Self-pay

## 2018-10-25 DIAGNOSIS — I712 Thoracic aortic aneurysm, without rupture, unspecified: Secondary | ICD-10-CM

## 2018-10-25 NOTE — Progress Notes (Signed)
Ct

## 2019-04-23 NOTE — Progress Notes (Signed)
Office Visit    Patient Name: Tony Murphy Date of Encounter: 04/23/2019  Primary Care Provider:  Lucille Passy, MD Primary Cardiologist:  Kathlyn Sacramento, MD Electrophysiologist:  None   Chief Complaint    Tony Murphy is a 49 y.o. 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  Past Medical History    Past Medical History:  Diagnosis Date  . Hyperlipidemia   . Hypertension    stage I  . Hypertrophic cardiomyopathy ([place])   . Hypogonadism male    Past Surgical History:  Procedure Laterality Date  . APPENDECTOMY    . HERNIA REPAIR    . TONSILLECTOMY      Allergies  No Known Allergies  History of Present Illness    Tony Murphy is a 49 y.o. 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 Fletcher Anon via telemedicine 09/11/2018.  He had a prior cardiac MRI showing maximal thickness of the basal septum 2.2 cm.  Aortic root was 37 mm and ascending aorta 41 mm.  Previous Holter monitor with occasional PVC but no other arrhythmia.  He was evaluated by Dr. Lovena Le who thought he was low risk for malignant arrhythmia.  Echocardiogram 06/2017 with normal LV systolic function with moderate asymmetric hypertrophy of the septum.  Aortic root 4.2 cm and ascending aorta 4 cm.  Noted mild mitral regurgitation.  Left atrium moderately dilated.  At his last visit his metoprolol was changed to carvedilol for optimization of his blood pressure.  He had a CT for monitoring of his aorta 09/2018.  It showed ascending thoracic aortic diameter 4.3 x 4.3 cm which is a slight increase compared to previous.Marland Kitchen  He was recommended for annual imaging.  Pleasant gentleman. Enjoys working from home for [employer]. Denies chest pain, pressure, tightness. No SOB nor DOE.   Tells me he and his wife have been  trying to exercise more. He rides on a bike at home and runs on a treadmill. Endorses he has more energy when he exercises regularly. Has slowly been making changes to his diet and is planning to do a Whole 30.  We discussed his baseline bradycardia.  He denies lightheadedness, dizziness, near syncope.  He monitors with his smart watch at home.  Reports no heart rates into the 30s.  EKGs/Labs/Other Studies Reviewed:   The following studies were reviewed today: CT angio chest aorta IMPRESSION: 1. Ascending thoracic aortic diameter measures 4.3 x 4.3 cm. No evident thoracic aortic dissection. Recommend annual imaging followup by CTA or MRA. This recommendation follows 2010 ACCF/AHA/AATS/ACR/ASA/SCA/SCAI/SIR/STS/SVM Guidelines for the Diagnosis and Management of Patients with Thoracic Aortic Disease. Circulation. 2010; 121: G182-X937. Aortic aneurysm NOS (ICD10-I71.9).   2.  No demonstrable pulmonary embolus.   3.  Lungs clear.   4.  No demonstrable thoracic adenopathy.  EKG:  EKG is  ordered today.  The ekg ordered today demonstrates sinus bradycardia 49 bpm no acute ST/T wave changes  Recent Labs: 09/14/2018: BUN 11; Creatinine, Ser 0.90; Potassium 4.2; Sodium 139  Recent Lipid Panel    Component Value Date/Time   CHOL 182 08/17/2009 1021   TRIG 74.0 08/17/2009 1021   HDL 39.80 08/17/2009 1021   CHOLHDL 5 08/17/2009 1021   VLDL 14.8 08/17/2009 1021   LDLCALC 127 (H) 08/17/2009 1021    Home Medications   No  outpatient medications have been marked as taking for the 04/24/19 encounter (Appointment) with Alver Sorrow, NP.    Review of Systems      Review of Systems  Constitution: Negative for chills, fever and malaise/fatigue.  Cardiovascular: Negative for chest pain, dyspnea on exertion, leg swelling, near-syncope, orthopnea, palpitations and syncope.  Respiratory: Negative for cough, shortness of breath and wheezing.   Gastrointestinal: Negative for nausea and vomiting.   Neurological: Negative for dizziness, light-headedness and weakness.   All other systems reviewed and are otherwise negative except as noted above.  Physical Exam    VS:  There were no vitals taken for this visit. , BMI There is no height or weight on file to calculate BMI. GEN: Well nourished, well developed, in no acute distress. HEENT: normal. Neck: Supple, no JVD, carotid bruits, or masses. Cardiac: RRR, no murmurs, rubs, or gallops. No clubbing, cyanosis, edema.  Radials/DP/PT 2+ and equal bilaterally.  Respiratory:  Respirations regular and unlabored, clear to auscultation bilaterally. GI: Soft, nontender, nondistended, BS + x 4. MS: No deformity or atrophy. Skin: Warm and dry, no rash. Neuro:  Strength and sensation are intact. Psych: Normal affect.  Accessory Clinical Findings    ECG personally reviewed by me today -sinus bradycardia 49 bpm with no acute ST/T wave changes- no acute changes.  Assessment & Plan    1. Hypertrophic cardiomyopathy -asymptomatic with no high-risk features for malignant arrhythmia or cardiac death.  Echo 07-04-17 was stable.  No indication for repeat echocardiogram at this time.  2. HTN - Blood pressure well controlled today.  Continue carvedilol 6.25 mg twice daily and amlodipine 5 mg daily.  3. HLD -not presently on lipid-lowering therapy.  No recent lipid panel available for review.  Will check lipid panel today.  4. Mildly dilated aortic root - Mild increase from previous on CT 09/2018 with measurement 4.3 x 4.3 cm.  We will plan to repeat CT July 2021.  5. Bradycardia -sinus bradycardia 49 bpm noted on EKG today.  He is asymptomatic with no lightheadedness, dizziness, fatigue.  He will report heart rates in the 30s or new onset of symptoms.  If so, will consider decreasing dose of Coreg.  Disposition: Follow up in July   for CT for monitoring of aorta with office visit 1-2 weeks afterward with Dr. Mardelle Matte, NP 04/23/2019, 8:03  PM

## 2019-04-24 ENCOUNTER — Ambulatory Visit (INDEPENDENT_AMBULATORY_CARE_PROVIDER_SITE_OTHER): Payer: No Typology Code available for payment source | Admitting: Family

## 2019-04-24 ENCOUNTER — Encounter: Payer: Self-pay | Admitting: Family

## 2019-04-24 ENCOUNTER — Other Ambulatory Visit: Payer: Self-pay

## 2019-04-24 VITALS — BP 126/80 | HR 49 | Ht 71.0 in | Wt 246.2 lb

## 2019-04-24 DIAGNOSIS — I429 Cardiomyopathy, unspecified: Secondary | ICD-10-CM

## 2019-04-24 DIAGNOSIS — I10 Essential (primary) hypertension: Secondary | ICD-10-CM | POA: Diagnosis not present

## 2019-04-24 DIAGNOSIS — I77810 Thoracic aortic ectasia: Secondary | ICD-10-CM | POA: Diagnosis not present

## 2019-04-24 DIAGNOSIS — E782 Mixed hyperlipidemia: Secondary | ICD-10-CM

## 2019-04-24 DIAGNOSIS — I422 Other hypertrophic cardiomyopathy: Secondary | ICD-10-CM | POA: Diagnosis not present

## 2019-04-24 NOTE — Patient Instructions (Addendum)
Medication Instructions:  No medication changes today.  *If you need a refill on your cardiac medications before your next appointment, please call your pharmacy*  Lab Work: Your physician recommends that you return for lab work today: CMET, lipid, direct LDL  If you have labs (blood work) drawn today and your tests are completely normal, you will receive your results only by: Marland Kitchen MyChart Message (if you have MyChart) OR . A paper copy in the mail If you have any lab test that is abnormal or we need to change your treatment, we will call you to review the results.  Testing/Procedures: You had an EKG today. It showed normal sinus rhythm.   We will plan to repeat your CT scan to look at your aorta in July.  Follow-Up: At [facility], you and your health needs are our priority.  As part of our continuing mission to provide you with exceptional heart care, we have created designated Provider Care Teams.  These Care Teams include your primary Cardiologist (physician) and Advanced Practice Providers (APPs -  Physician Assistants and Nurse Practitioners) who all work together to provide you with the care you need, when you need it.  Your next appointment:   6 month(s)  The format for your next appointment:   In Person  Provider:   Lorine Bears, MD  Other Instructions   DASH Eating Plan DASH stands for "Dietary Approaches to Stop Hypertension." The DASH eating plan is a healthy eating plan that has been shown to reduce high blood pressure (hypertension). It may also reduce your risk for type 2 diabetes, heart disease, and stroke. The DASH eating plan may also help with weight loss. What are tips for following this plan?  General guidelines  Avoid eating more than 2,300 mg (milligrams) of salt (sodium) a day. If you have hypertension, you may need to reduce your sodium intake to 1,500 mg a day.  Limit alcohol intake to no more than 1 drink a day for nonpregnant women and 2 drinks a  day for men. One drink equals 12 oz of beer, 5 oz of wine, or 1 oz of hard liquor.  Work with your health care provider to maintain a healthy body weight or to lose weight. Ask what an ideal weight is for you.  Get at least 30 minutes of exercise that causes your heart to beat faster (aerobic exercise) most days of the week. Activities may include walking, swimming, or biking.  Work with your health care provider or diet and nutrition specialist (dietitian) to adjust your eating plan to your individual calorie needs. Reading food labels   Check food labels for the amount of sodium per serving. Choose foods with less than 5 percent of the Daily Value of sodium. Generally, foods with less than 300 mg of sodium per serving fit into this eating plan.  To find whole grains, look for the word "whole" as the first word in the ingredient list. Shopping  Buy products labeled as "low-sodium" or "no salt added."  Buy fresh foods. Avoid canned foods and premade or frozen meals. Cooking  Avoid adding salt when cooking. Use salt-free seasonings or herbs instead of table salt or sea salt. Check with your health care provider or pharmacist before using salt substitutes.  Do not fry foods. Cook foods using healthy methods such as baking, boiling, grilling, and broiling instead.  Cook with heart-healthy oils, such as olive, canola, soybean, or sunflower oil. Meal planning  Eat a balanced diet that  includes: ? 5 or more servings of fruits and vegetables each day. At each meal, try to fill half of your plate with fruits and vegetables. ? Up to 6-8 servings of whole grains each day. ? Less than 6 oz of lean meat, poultry, or fish each day. A 3-oz serving of meat is about the same size as a deck of cards. One egg equals 1 oz. ? 2 servings of low-fat dairy each day. ? A serving of nuts, seeds, or beans 5 times each week. ? Heart-healthy fats. Healthy fats called Omega-3 fatty acids are found in foods such  as flaxseeds and coldwater fish, like sardines, salmon, and mackerel.  Limit how much you eat of the following: ? Canned or prepackaged foods. ? Food that is high in trans fat, such as fried foods. ? Food that is high in saturated fat, such as fatty meat. ? Sweets, desserts, sugary drinks, and other foods with added sugar. ? Full-fat dairy products.  Do not salt foods before eating.  Try to eat at least 2 vegetarian meals each week.  Eat more home-cooked food and less restaurant, buffet, and fast food.  When eating at a restaurant, ask that your food be prepared with less salt or no salt, if possible. What foods are recommended? The items listed may not be a complete list. Talk with your dietitian about what dietary choices are best for you. Grains Whole-grain or whole-wheat bread. Whole-grain or whole-wheat pasta. Brown rice. Modena [place]row. Bulgur. Whole-grain and low-sodium cereals. Pita bread. Low-fat, low-sodium crackers. Whole-wheat flour tortillas. Vegetables Fresh or frozen vegetables (raw, steamed, roasted, or grilled). Low-sodium or reduced-sodium tomato and vegetable juice. Low-sodium or reduced-sodium tomato sauce and tomato paste. Low-sodium or reduced-sodium canned vegetables. Fruits All fresh, dried, or frozen fruit. Canned fruit in natural juice (without added sugar). Meat and other protein foods Skinless chicken or [place]. Ground chicken or [place]. Pork with fat trimmed off. Fish and seafood. Egg whites. Dried beans, peas, or lentils. Unsalted nuts, nut butters, and seeds. Unsalted canned beans. Lean cuts of beef with fat trimmed off. Low-sodium, lean deli meat. Dairy Low-fat (1%) or fat-free (skim) milk. Fat-free, low-fat, or reduced-fat cheeses. Nonfat, low-sodium ricotta or cottage cheese. Low-fat or nonfat yogurt. Low-fat, low-sodium cheese. Fats and oils Soft margarine without trans fats. Vegetable oil. Low-fat, reduced-fat, or light mayonnaise and salad dressings  (reduced-sodium). Canola, safflower, olive, soybean, and sunflower oils. Avocado. Seasoning and other foods Herbs. Spices. Seasoning mixes without salt. Unsalted popcorn and pretzels. Fat-free sweets. What foods are not recommended? The items listed may not be a complete list. Talk with your dietitian about what dietary choices are best for you. Grains Baked goods made with fat, such as croissants, muffins, or some breads. Dry pasta or rice meal packs. Vegetables Creamed or fried vegetables. Vegetables in a cheese sauce. Regular canned vegetables (not low-sodium or reduced-sodium). Regular canned tomato sauce and paste (not low-sodium or reduced-sodium). Regular tomato and vegetable juice (not low-sodium or reduced-sodium). Angie Fava. Olives. Fruits Canned fruit in a light or heavy syrup. Fried fruit. Fruit in cream or butter sauce. Meat and other protein foods Fatty cuts of meat. Ribs. Fried meat. Berniece Salines. Sausage. Bologna and other processed lunch meats. Salami. Fatback. Hotdogs. Bratwurst. Salted nuts and seeds. Canned beans with added salt. Canned or smoked fish. Whole eggs or egg yolks. Chicken or [place] with skin. Dairy Whole or 2% milk, cream, and half-and-half. Whole or full-fat cream cheese. Whole-fat or sweetened yogurt. Full-fat cheese. Nondairy creamers. Whipped  toppings. Processed cheese and cheese spreads. Fats and oils Butter. Stick margarine. Lard. Shortening. Ghee. Bacon fat. Tropical oils, such as coconut, palm kernel, or palm oil. Seasoning and other foods Salted popcorn and pretzels. Onion salt, garlic salt, seasoned salt, table salt, and sea salt. Worcestershire sauce. Tartar sauce. Barbecue sauce. Teriyaki sauce. Soy sauce, including reduced-sodium. Steak sauce. Canned and packaged gravies. Fish sauce. Oyster sauce. Cocktail sauce. Horseradish that you find on the shelf. Ketchup. Mustard. Meat flavorings and tenderizers. Bouillon cubes. Hot sauce and Tabasco sauce. Premade or  packaged marinades. Premade or packaged taco seasonings. Relishes. Regular salad dressings. Where to find more information:  National Heart, Lung, and Blood Institute: PopSteam.is  American Heart Association: www.heart.org Summary  The DASH eating plan is a healthy eating plan that has been shown to reduce high blood pressure (hypertension). It may also reduce your risk for type 2 diabetes, heart disease, and stroke.  With the DASH eating plan, you should limit salt (sodium) intake to 2,300 mg a day. If you have hypertension, you may need to reduce your sodium intake to 1,500 mg a day.  When on the DASH eating plan, aim to eat more fresh fruits and vegetables, whole grains, lean proteins, low-fat dairy, and heart-healthy fats.  Work with your health care provider or diet and nutrition specialist (dietitian) to adjust your eating plan to your individual calorie needs. This information is not intended to replace advice given to you by your health care provider. Make sure you discuss any questions you have with your health care provider. Document Revised: 02/10/2017 Document Reviewed: 02/22/2016 Elsevier Patient Education  2020 [employer].

## 2019-04-25 LAB — COMPREHENSIVE METABOLIC PANEL
ALT: 19 IU/L (ref 0–44)
AST: 41 IU/L — ABNORMAL HIGH (ref 0–40)
Albumin/Globulin Ratio: 1.8 (ref 1.2–2.2)
Albumin: 4.5 g/dL (ref 4.0–5.0)
Alkaline Phosphatase: 71 IU/L (ref 39–117)
BUN/Creatinine Ratio: 12 (ref 9–20)
BUN: 10 mg/dL (ref 6–24)
Bilirubin Total: 0.4 mg/dL (ref 0.0–1.2)
CO2: 19 mmol/L — ABNORMAL LOW (ref 20–29)
Calcium: 9.8 mg/dL (ref 8.7–10.2)
Chloride: 105 mmol/L (ref 96–106)
Creatinine, Ser: 0.81 mg/dL (ref 0.76–1.27)
GFR calc Af Amer: 121 mL/min/{1.73_m2} (ref >59)
GFR calc non Af Amer: 105 mL/min/{1.73_m2} (ref >59)
Globulin, Total: 2.5 g/dL (ref 1.5–4.5)
Glucose: 93 mg/dL (ref 65–99)
Potassium: 4.9 mmol/L (ref 3.5–5.2)
Sodium: 141 mmol/L (ref 134–144)
Total Protein: 7 g/dL (ref 6.0–8.5)

## 2019-04-25 LAB — LDL CHOLESTEROL, DIRECT: LDL Direct: 137 mg/dL — ABNORMAL HIGH (ref 0–99)

## 2019-04-25 LAB — CBC
Hematocrit: 43.7 % (ref 37.5–51.0)
Hemoglobin: 15.3 g/dL (ref 13.0–17.7)
MCH: 31.7 pg (ref 26.6–33.0)
MCHC: 35 g/dL (ref 31.5–35.7)
MCV: 91 fL (ref 79–97)
Platelets: 318 10*3/uL (ref 150–450)
RBC: 4.83 x10E6/uL (ref 4.14–5.80)
RDW: 12.3 % (ref 11.6–15.4)
WBC: 5.9 10*3/uL (ref 3.4–10.8)

## 2019-04-26 ENCOUNTER — Encounter: Payer: Self-pay | Admitting: Family

## 2019-04-29 ENCOUNTER — Telehealth: Payer: Self-pay

## 2019-04-29 DIAGNOSIS — E782 Mixed hyperlipidemia: Secondary | ICD-10-CM

## 2019-04-29 NOTE — Telephone Encounter (Signed)
Call to patient to discuss labs. Pt verbalized understanding and would like to make dietary changes to improve cholesterol and [facility] 3 months. Order placed.   Advised pt to call for any further questions or concerns.

## 2019-04-29 NOTE — Telephone Encounter (Signed)
-----   Message from Alver Sorrow, NP sent at 04/26/2019  5:27 PM EST ----- Normal kidney function. Liver function stable. Normal electrolytes. CBC with no evidence of anemia/infection. LDL or "bad cholesterol" is elevated at 137. Our goal would be for it to be less than 100.   If you would like to make dietary changes and recheck in 3 months time, that is reasonable. If you feel you are eating a cholesterol healthy diet (little fried food, lots of vegetables), would recommend addition of Atorvastatin 20mg  daily.

## 2019-05-14 ENCOUNTER — Encounter: Payer: No Typology Code available for payment source | Admitting: Primary Care

## 2019-06-05 ENCOUNTER — Other Ambulatory Visit: Payer: Self-pay

## 2019-06-05 ENCOUNTER — Ambulatory Visit (INDEPENDENT_AMBULATORY_CARE_PROVIDER_SITE_OTHER): Payer: 59 | Admitting: Primary Care

## 2019-06-05 ENCOUNTER — Encounter: Payer: Self-pay | Admitting: Primary Care

## 2019-06-05 VITALS — BP 124/78 | HR 50 | Temp 98.2°F | Wt 228.0 lb

## 2019-06-05 DIAGNOSIS — Z1211 Encounter for screening for malignant neoplasm of colon: Secondary | ICD-10-CM

## 2019-06-05 DIAGNOSIS — I10 Essential (primary) hypertension: Secondary | ICD-10-CM

## 2019-06-05 DIAGNOSIS — Z0000 Encounter for general adult medical examination without abnormal findings: Secondary | ICD-10-CM

## 2019-06-05 DIAGNOSIS — I77819 Aortic ectasia, unspecified site: Secondary | ICD-10-CM | POA: Diagnosis not present

## 2019-06-05 DIAGNOSIS — Z23 Encounter for immunization: Secondary | ICD-10-CM | POA: Diagnosis not present

## 2019-06-05 DIAGNOSIS — I422 Other hypertrophic cardiomyopathy: Secondary | ICD-10-CM

## 2019-06-05 DIAGNOSIS — E785 Hyperlipidemia, unspecified: Secondary | ICD-10-CM

## 2019-06-05 LAB — LIPID PANEL
Cholesterol: 175 mg/dL (ref 0–200)
HDL: 40.1 mg/dL (ref >39.00)
LDL Cholesterol: 115 mg/dL — ABNORMAL HIGH (ref 0–99)
NonHDL: 135
Total CHOL/HDL Ratio: 4
Triglycerides: 102 mg/dL (ref 0.0–149.0)
VLDL: 20.4 mg/dL (ref 0.0–40.0)

## 2019-06-05 NOTE — Addendum Note (Signed)
Addended by: Roena Malady on: 06/05/2019 09:02 AM   Modules accepted: Orders

## 2019-06-05 NOTE — Assessment & Plan Note (Signed)
Following with cardiology, due for repeat echocardiogram soon.

## 2019-06-05 NOTE — Assessment & Plan Note (Addendum)
Tetanus due, provided today. Commended him on lifestyle changes, encouraged to continue.  Colonoscopy due, referral placed.   Exam today unremarkable. Labs pending.

## 2019-06-05 NOTE — Assessment & Plan Note (Signed)
Stable in the office today, continue current regimen of Amlodipine 5 mg and carvedilol 6.25 mg BID.

## 2019-06-05 NOTE — Progress Notes (Signed)
Subjective:    Patient ID: Tony Murphy, male    DOB: 08/12/1970, 49 y.o.   MRN: [id number]  HPI  This visit occurred during the SARS-CoV-2 public health emergency.  Safety protocols were in place, including screening questions prior to the visit, additional usage of staff PPE, and extensive cleaning of exam room while observing appropriate contact time as indicated for disinfecting solutions.   Tony Murphy is a 49 year old male who presents today to transfer care from Dr. Deborra Medina and for complete physical.  Immunizations: -Tetanus: Unsure, over 10 years ago. -Influenza: Did not complete last season  Diet: He endorses a healthy diet. Exercise: Cardio five days weekly.   Eye exam: Completed in 2021 Dental exam: Completes semi-annually   Colonoscopy: Never completed   BP Readings from Last 3 Encounters:  06/05/19 124/78  04/24/19 126/80  09/11/18 135/74     Review of Systems  Constitutional: Negative for unexpected weight change.  HENT: Negative for rhinorrhea.   Respiratory: Negative for shortness of breath.   Cardiovascular: Negative for chest pain.  Gastrointestinal: Negative for constipation and diarrhea.  Genitourinary: Negative for difficulty urinating.  Musculoskeletal: Negative for arthralgias and myalgias.  Skin: Negative for rash.  Allergic/Immunologic: Negative for environmental allergies.  Neurological: Negative for dizziness, numbness and headaches.  Psychiatric/Behavioral: The patient is not nervous/anxious.        Past Medical History:  Diagnosis Date  . Hyperlipidemia   . Hypertension    stage I  . Hypertrophic cardiomyopathy ([place])   . Hypogonadism male      Social History   Socioeconomic History  . Marital status: Married    Spouse name: Not on file  . Number of children: Not on file  . Years of education: Not on file  . Highest education level: Not on file  Occupational History  . Occupation: Human Resoures consult    Employer: [employer]  Tobacco Use  . Smoking status: Never Smoker  . Smokeless tobacco: Never Used  Substance and Sexual Activity  . Alcohol use: Yes    Comment: occasional  . Drug use: No  . Sexual activity: Not on file  Other Topics Concern  . Not on file  Social History Narrative  . Not on file   Social Determinants of Health   Financial Resource Strain:   . Difficulty of Paying Living Expenses:   Food Insecurity:   . Worried About Charity fundraiser in the Last Year:   . Arboriculturist in the Last Year:   Transportation Needs:   . Film/video editor (Medical):   Marland Kitchen Lack of Transportation (Non-Medical):   Physical Activity:   . Days of Exercise per Week:   . Minutes of Exercise per Session:   Stress:   . Feeling of Stress :   Social Connections:   . Frequency of Communication with Friends and Family:   . Frequency of Social Gatherings with Friends and Family:   . Attends Religious Services:   . Active Member of Clubs or Organizations:   . Attends Archivist Meetings:   Marland Kitchen Marital Status:   Intimate Partner Violence:   . Fear of Current or Ex-Partner:   . Emotionally Abused:   Marland Kitchen Physically Abused:   . Sexually Abused:     Past Surgical History:  Procedure Laterality Date  . APPENDECTOMY    . HERNIA REPAIR    . TONSILLECTOMY      Family History  Problem Relation Age  of Onset  . Heart attack Father   . Hypertension Father        Heart Attack  . Heart disease Maternal Grandmother   . Atrial fibrillation Mother   . Heart attack Maternal Grandfather   . Heart attack Paternal Grandfather        sudden cardiac death  . Stroke Brother     No Known Allergies  Current Outpatient Medications on File Prior to Visit  Medication Sig Dispense Refill  . amLODipine (NORVASC) 5 MG tablet Take 1 tablet (5 mg total) by mouth daily. 90 tablet 3  . carvedilol (COREG) 6.25 MG tablet Take 1 tablet (6.25 mg total) by mouth 2 (two) times daily. 180 tablet 3   No  current facility-administered medications on file prior to visit.    BP 124/78   Pulse (!) 50   Temp 98.2 F (36.8 C) (Temporal)   Wt 228 lb (103.4 kg)   SpO2 98%   BMI 31.80 kg/m    Objective:   Physical Exam  Constitutional: He is oriented to person, place, and time. He appears well-nourished.  HENT:  Right Ear: Tympanic membrane and ear canal normal.  Left Ear: Tympanic membrane and ear canal normal.  Mouth/Throat: Oropharynx is clear and moist.  Eyes: Pupils are equal, round, and reactive to light. EOM are normal.  Cardiovascular: Normal rate and regular rhythm.  Respiratory: Effort normal and breath sounds normal.  GI: Soft. Bowel sounds are normal. There is no abdominal tenderness.  Musculoskeletal:        General: Normal range of motion.     Cervical back: Neck supple.  Neurological: He is alert and oriented to person, place, and time. No cranial nerve deficit.  Reflex Scores:      Patellar reflexes are 2+ on the right side and 2+ on the left side. Skin: Skin is warm and dry.  Psychiatric: He has a normal mood and affect.           Assessment & Plan:

## 2019-06-05 NOTE — Assessment & Plan Note (Signed)
LDL above goal at 137 on labs from early February 2021. He has since changed his diet and has been exercising regularly. Repeat lipids pending.

## 2019-06-05 NOTE — Assessment & Plan Note (Signed)
Following with cardiology, due for repeat echocardiogram this year. Also under evaluation for slight aortic root dilation.

## 2019-06-05 NOTE — Patient Instructions (Signed)
Continue exercising. You should be getting 150 minutes of moderate intensity exercise weekly.  Continue to work on a healthy diet. Ensure you are consuming 64 ounces of water daily.  You will be contacted regarding your referral to GI for the colonoscopy.  Please let us know if you have not been contacted within two weeks.   Stop by the lab prior to leaving today. I will notify you of your results once received.   It was a pleasure meeting you!   Preventive Care 102-48 Years Old, Male Preventive care refers to lifestyle choices and visits with your health care provider that can promote health and wellness. This includes:  A yearly physical exam. This is also called an annual well check.  Regular dental and eye exams.  Immunizations.  Screening for certain conditions.  Healthy lifestyle choices, such as eating a healthy diet, getting regular exercise, not using drugs or products that contain nicotine and tobacco, and limiting alcohol use. What can I expect for my preventive care visit? Physical exam Your health care provider will check:  Height and weight. These may be used to calculate body mass index (BMI), which is a measurement that tells if you are at a healthy weight.  Heart rate and blood pressure.  Your skin for abnormal spots. Counseling Your health care provider may ask you questions about:  Alcohol, tobacco, and drug use.  Emotional well-being.  Home and relationship well-being.  Sexual activity.  Eating habits.  Work and work Statistician. What immunizations do I need?  Influenza (flu) vaccine  This is recommended every year. Tetanus, diphtheria, and pertussis (Tdap) vaccine  You may need a Td booster every 10 years. Varicella (chickenpox) vaccine  You may need this vaccine if you have not already been vaccinated. Zoster (shingles) vaccine  You may need this after age 28. Measles, mumps, and rubella (MMR) vaccine  You may need at least one dose  of MMR if you were born in 1957 or later. You may also need a second dose. Pneumococcal conjugate (PCV13) vaccine  You may need this if you have certain conditions and were not previously vaccinated. Pneumococcal polysaccharide (PPSV23) vaccine  You may need one or two doses if you smoke cigarettes or if you have certain conditions. Meningococcal conjugate (MenACWY) vaccine  You may need this if you have certain conditions. Hepatitis A vaccine  You may need this if you have certain conditions or if you travel or work in places where you may be exposed to hepatitis A. Hepatitis B vaccine  You may need this if you have certain conditions or if you travel or work in places where you may be exposed to hepatitis B. Haemophilus influenzae type b (Hib) vaccine  You may need this if you have certain risk factors. Human papillomavirus (HPV) vaccine  If recommended by your health care provider, you may need three doses over 6 months. You may receive vaccines as individual doses or as more than one vaccine together in one shot (combination vaccines). Talk with your health care provider about the risks and benefits of combination vaccines. What tests do I need? Blood tests  Lipid and cholesterol levels. These may be checked every 5 years, or more frequently if you are over 50 years old.  Hepatitis C test.  Hepatitis B test. Screening  Lung cancer screening. You may have this screening every year starting at age 36 if you have a 30-pack-year history of smoking and currently smoke or have quit within the past  15 years.  Prostate cancer screening. Recommendations will vary depending on your family history and other risks.  Colorectal cancer screening. All adults should have this screening starting at age 73 and continuing until age 31. Your health care provider may recommend screening at age 62 if you are at increased risk. You will have tests every 1-10 years, depending on your results and the  type of screening test.  Diabetes screening. This is done by checking your blood sugar (glucose) after you have not eaten for a while (fasting). You may have this done every 1-3 years.  Sexually transmitted disease (STD) testing. Follow these instructions at home: Eating and drinking  Eat a diet that includes fresh fruits and vegetables, whole grains, lean protein, and low-fat dairy products.  Take vitamin and mineral supplements as recommended by your health care provider.  Do not drink alcohol if your health care provider tells you not to drink.  If you drink alcohol: ? Limit how much you have to 0-2 drinks a day. ? Be aware of how much alcohol is in your drink. In the [place] one drink equals one 12 oz bottle of beer (355 mL), one 5 oz glass of wine (148 mL), or one 1 oz glass of hard liquor (44 mL). Lifestyle  Take daily care of your teeth and gums.  Stay active. Exercise for at least 30 minutes on 5 or more days each week.  Do not use any products that contain nicotine or tobacco, such as cigarettes, e-cigarettes, and chewing tobacco. If you need help quitting, ask your health care provider.  If you are sexually active, practice safe sex. Use a condom or other form of protection to prevent STIs (sexually transmitted infections).  Talk with your health care provider about taking a low-dose aspirin every day starting at age 66. What's next?  Go to your health care provider once a year for a well check visit.  Ask your health care provider how often you should have your eyes and teeth checked.  Stay up to date on all vaccines. This information is not intended to replace advice given to you by your health care provider. Make sure you discuss any questions you have with your health care provider. Document Revised: 02/22/2018 Document Reviewed: 02/22/2018 Elsevier Patient Education  2020 [employer].

## 2019-06-21 ENCOUNTER — Ambulatory Visit (INDEPENDENT_AMBULATORY_CARE_PROVIDER_SITE_OTHER): Payer: Self-pay | Admitting: Gastroenterology

## 2019-06-21 DIAGNOSIS — Z1211 Encounter for screening for malignant neoplasm of colon: Secondary | ICD-10-CM

## 2019-06-21 DIAGNOSIS — Z8371 Family history of colonic polyps: Secondary | ICD-10-CM

## 2019-06-21 NOTE — Progress Notes (Signed)
Gastroenterology Pre-Procedure Review  Request Date: Thursday 07/11/19 Requesting Physician: Dr. Tobi Bastos  PATIENT REVIEW QUESTIONS: The patient responded to the following health history questions as indicated:    1. Are you having any GI issues? no 2. Do you have a personal history of Polyps? no 3. Do you have a family history of Colon Cancer or Polyps? no 4. Diabetes Mellitus? no 5. Joint replacements in the past 12 months?no 6. Major health problems in the past 3 months?no 7. Any artificial heart valves, MVP, or defibrillator?no    MEDICATIONS & ALLERGIES:    Patient reports the following regarding taking any anticoagulation/antiplatelet therapy:   Plavix, Coumadin, Eliquis, Xarelto, Lovenox, Pradaxa, Brilinta, or Effient? no Aspirin? no  Patient confirms/reports the following medications:  Current Outpatient Medications  Medication Sig Dispense Refill  . amLODipine (NORVASC) 5 MG tablet Take 1 tablet (5 mg total) by mouth daily. 90 tablet 3  . carvedilol (COREG) 6.25 MG tablet Take 1 tablet (6.25 mg total) by mouth 2 (two) times daily. 180 tablet 3   No current facility-administered medications for this visit.    Patient confirms/reports the following allergies:  No Known Allergies  No orders of the defined types were placed in this encounter.   AUTHORIZATION INFORMATION Primary Insurance: 1D#: Group #:  Secondary Insurance: 1D#: Group #:  SCHEDULE INFORMATION: Date: 07/11/19 Time: Location:[facility]

## 2019-07-09 ENCOUNTER — Other Ambulatory Visit
Admission: RE | Admit: 2019-07-09 | Discharge: 2019-07-09 | Disposition: A | Discharge status: Home / Self Care | Payer: 59 | Source: Ambulatory Visit | Attending: Gastroenterology | Admitting: Gastroenterology

## 2019-07-09 ENCOUNTER — Telehealth: Payer: Self-pay | Admitting: Cardiovascular Disease

## 2019-07-09 DIAGNOSIS — Z01812 Encounter for preprocedural laboratory examination: Secondary | ICD-10-CM | POA: Insufficient documentation

## 2019-07-09 DIAGNOSIS — Z20822 Contact with and (suspected) exposure to covid-19: Secondary | ICD-10-CM | POA: Diagnosis not present

## 2019-07-09 LAB — SARS CORONAVIRUS 2 (TAT 6-24 HRS): SARS Coronavirus 2: NEGATIVE

## 2019-07-09 NOTE — Telephone Encounter (Signed)
   Primary Cardiologist: Lorine Bears, MD  Chart reviewed as part of pre-operative protocol coverage. Patient was contacted 07/09/2019 in reference to pre-operative risk assessment for pending surgery as outlined below.  Tony Murphy was last seen on 05/05/19 by Lebron Conners, NP.  Since that day, Tony Murphy has done well. Does exercise every day. Easily getting > 4 mets of activity.  Therefore, based on ACC/AHA guidelines, the patient would be at acceptable risk for the planned procedure without further cardiovascular testing.   I will route this recommendation to the requesting party via Epic fax function and remove from pre-op pool.  Please call with questions.  [place], [place] 07/09/2019, 1:12 PM

## 2019-07-09 NOTE — Telephone Encounter (Signed)
   [facility] Pre-operative Risk Assessment    Request for surgical clearance:  1. What type of surgery is being performed? COLONOSCOPY 2.  3. When is this surgery scheduled? 07/11/19  4. What type of clearance is required (medical clearance vs. Pharmacy clearance to hold med vs. Both)? NOT LISTED  5. Are there any medications that need to be held prior to surgery and how long? NOT LISTED  6. Practice name and name of physician performing surgery? [facility] ,DR ANNA  7. What is your office phone number [phone]   7.   What is your office fax number [phone]  8.   Anesthesia type (None, local, MAC, general) ? Not listed   Tony Murphy 07/09/2019, 10:13 AM  _________________________________________________________________   (provider comments below)

## 2019-07-11 ENCOUNTER — Other Ambulatory Visit: Payer: Self-pay

## 2019-07-11 ENCOUNTER — Encounter
Admission: RE | Disposition: A | Discharge status: Home / Self Care | Payer: Self-pay | Source: Home / Self Care | Attending: Gastroenterology

## 2019-07-11 ENCOUNTER — Encounter: Payer: Self-pay | Admitting: Gastroenterology

## 2019-07-11 ENCOUNTER — Ambulatory Visit: Payer: No Typology Code available for payment source | Admitting: Anesthesiology

## 2019-07-11 ENCOUNTER — Ambulatory Visit
Admission: RE | Admit: 2019-07-11 | Discharge: 2019-07-11 | Disposition: A | Discharge status: Home / Self Care | Payer: No Typology Code available for payment source | Attending: Gastroenterology | Admitting: Gastroenterology

## 2019-07-11 DIAGNOSIS — Z8249 Family history of ischemic heart disease and other diseases of the circulatory system: Secondary | ICD-10-CM | POA: Insufficient documentation

## 2019-07-11 DIAGNOSIS — K640 First degree hemorrhoids: Secondary | ICD-10-CM | POA: Diagnosis not present

## 2019-07-11 DIAGNOSIS — Z87891 Personal history of nicotine dependence: Secondary | ICD-10-CM | POA: Insufficient documentation

## 2019-07-11 DIAGNOSIS — I422 Other hypertrophic cardiomyopathy: Secondary | ICD-10-CM | POA: Insufficient documentation

## 2019-07-11 DIAGNOSIS — Z8371 Family history of colonic polyps: Secondary | ICD-10-CM | POA: Insufficient documentation

## 2019-07-11 DIAGNOSIS — Z79899 Other long term (current) drug therapy: Secondary | ICD-10-CM | POA: Insufficient documentation

## 2019-07-11 DIAGNOSIS — I10 Essential (primary) hypertension: Secondary | ICD-10-CM | POA: Diagnosis not present

## 2019-07-11 DIAGNOSIS — Z1211 Encounter for screening for malignant neoplasm of colon: Secondary | ICD-10-CM | POA: Diagnosis not present

## 2019-07-11 HISTORY — PX: COLONOSCOPY WITH PROPOFOL: SHX5780

## 2019-07-11 SURGERY — COLONOSCOPY WITH PROPOFOL
Anesthesia: General

## 2019-07-11 MED ORDER — PROPOFOL 500 MG/50ML IV EMUL
INTRAVENOUS | Status: AC
  Filled 2019-07-11: qty 50

## 2019-07-11 MED ORDER — PROPOFOL 500 MG/50ML IV EMUL
INTRAVENOUS | Status: DC | PRN
  Administered 2019-07-11: 200 ug/kg/min via INTRAVENOUS

## 2019-07-11 MED ORDER — FENTANYL CITRATE (PF) 100 MCG/2ML IJ SOLN
INTRAMUSCULAR | Status: AC
  Filled 2019-07-11: qty 2

## 2019-07-11 MED ORDER — PROPOFOL 10 MG/ML IV BOLUS
INTRAVENOUS | Status: DC | PRN
  Administered 2019-07-11: 100 mg via INTRAVENOUS
  Administered 2019-07-11: 50 mg via INTRAVENOUS

## 2019-07-11 MED ORDER — EPHEDRINE SULFATE 50 MG/ML IJ SOLN
INTRAMUSCULAR | Status: DC | PRN
  Administered 2019-07-11: 10 mg via INTRAVENOUS
  Administered 2019-07-11: 15 mg via INTRAVENOUS

## 2019-07-11 MED ORDER — FENTANYL CITRATE (PF) 100 MCG/2ML IJ SOLN
INTRAMUSCULAR | Status: DC | PRN
  Administered 2019-07-11: 50 ug via INTRAVENOUS

## 2019-07-11 MED ORDER — LIDOCAINE 2% (20 MG/ML) 5 ML SYRINGE
INTRAMUSCULAR | Status: DC | PRN
  Administered 2019-07-11: 50 mg via INTRAVENOUS

## 2019-07-11 MED ORDER — EPHEDRINE 5 MG/ML INJ
INTRAVENOUS | Status: AC
  Filled 2019-07-11: qty 4

## 2019-07-11 MED ORDER — LIDOCAINE HCL (PF) 2 % IJ SOLN
INTRAMUSCULAR | Status: AC
  Filled 2019-07-11: qty 5

## 2019-07-11 MED ORDER — SODIUM CHLORIDE 0.9 % IV SOLN
INTRAVENOUS | Status: DC
  Administered 2019-07-11: 1000 mL via INTRAVENOUS

## 2019-07-11 NOTE — Op Note (Signed)
[facility] Gastroenterology Patient Name: Tony Murphy Procedure Date: 07/11/2019 7:44 AM MRN: [id number] Account #: [id number] Date of Birth: 02/11/1971 Admit Type: Outpatient Age: 49 Room: [facility] ENDO ROOM 1 Gender: Male Note Status: Finalized Procedure:             Colonoscopy Indications:           Colon cancer screening in patient at increased risk:                         Family history of 1st-degree relative with colon polyps Providers:             Wyline Mood MD, MD Referring MD:          Doreene Nest (Referring MD) Medicines:             Monitored Anesthesia Care Complications:         No immediate complications. Procedure:             Pre-Anesthesia Assessment:                        - Prior to the procedure, a History and Physical was                         performed, and patient medications, allergies and                         sensitivities were reviewed. The patient's tolerance                         of previous anesthesia was reviewed.                        - The risks and benefits of the procedure and the                         sedation options and risks were discussed with the                         patient. All questions were answered and informed                         consent was obtained.                        - The risks and benefits of the procedure and the                         sedation options and risks were discussed with the                         patient. All questions were answered and informed                         consent was obtained.                        - ASA Grade Assessment: II - A patient with mild  systemic disease.                        After obtaining informed consent, the colonoscope was                         passed under direct vision. Throughout the procedure,                         the patient's blood pressure, pulse, and oxygen                         saturations were  monitored continuously. The                         Colonoscope was introduced through the anus and                         advanced to the the cecum, identified by the                         appendiceal orifice. The colonoscopy was performed                         with ease. The patient tolerated the procedure well.                         The quality of the bowel preparation was excellent. Findings:      The perianal and digital rectal examinations were normal.      Non-bleeding internal hemorrhoids were found during retroflexion. The       hemorrhoids were medium-sized and Grade I (internal hemorrhoids that do       not prolapse).      The exam was otherwise without abnormality on direct and retroflexion       views. Impression:            - Non-bleeding internal hemorrhoids.                        - The examination was otherwise normal on direct and                         retroflexion views.                        - No specimens collected. Recommendation:        - Discharge patient to home (with escort).                        - Resume previous diet.                        - Continue present medications.                        - Repeat colonoscopy in 5 years for surveillance. Procedure Code(s):     --- Professional ---                        [phone], Colonoscopy, flexible; diagnostic, including  collection of specimen(s) by brushing or washing, when                         performed (separate procedure) Diagnosis Code(s):     --- Professional ---                        Z83.71, Family history of colonic polyps                        K64.0, First degree hemorrhoids CPT copyright 2019 American Medical Association. All rights reserved. The codes documented in this report are preliminary and upon coder review may  be revised to meet current compliance requirements. Jonathon Bellows, MD Jonathon Bellows MD, MD 07/11/2019 8:32:49 AM This report has been signed  electronically. Number of Addenda: 0 Note Initiated On: 07/11/2019 7:44 AM Scope Withdrawal Time: 0 hours 8 minutes 32 seconds  Total Procedure Duration: 0 hours 13 minutes 7 seconds  Estimated Blood Loss:  Estimated blood loss: none.      [facility]

## 2019-07-11 NOTE — Anesthesia Postprocedure Evaluation (Signed)
Anesthesia Post Note  Patient: Tony Murphy  Procedure(s) Performed: COLONOSCOPY WITH PROPOFOL (N/A )  Patient location during evaluation: Endoscopy Anesthesia Type: General Level of consciousness: awake and alert Pain management: pain level controlled Vital Signs Assessment: post-procedure vital signs reviewed and stable Respiratory status: spontaneous breathing, nonlabored ventilation and respiratory function stable Cardiovascular status: blood pressure returned to baseline and stable Postop Assessment: no apparent nausea or vomiting Anesthetic complications: no     Last Vitals:  Vitals:   07/11/19 0852 07/11/19 0902  BP: 134/85   Pulse: 61 66  Resp: 14 15  Temp:    SpO2: 100% 100%    Last Pain:  Vitals:   07/11/19 0902  TempSrc:   PainSc: 0-No pain                 Christia Reading

## 2019-07-11 NOTE — Anesthesia Preprocedure Evaluation (Addendum)
Anesthesia Evaluation  Patient identified by MRN, date of birth, ID band Patient awake    Reviewed: Allergy & Precautions, H&P , NPO status , reviewed documented beta blocker date and time   Airway Mallampati: II  TM Distance: >3 FB Neck ROM: full    Dental  (+) Caps, Teeth Intact   Pulmonary former smoker,    Pulmonary exam normal        Cardiovascular hypertension, Normal cardiovascular exam  2019 ECHO Study Conclusions   - Left ventricle: The cavity size was normal. There was moderate  asymmetric hypertrophy of the septum Systolic function was  normal. The estimated ejection fraction was in the range of 55%  to 60%. Wall motion was normal; there were no regional wall  motion abnormalities. Left ventricular diastolic function  parameters were normal.  - Aortic root: The aortic root was mild to moderately dilated 4.2  cm  - Ascending aorta: The ascending aorta was mildly dilated, 4.0 cm  - Mitral valve: There was mild regurgitation.  - Left atrium: The atrium was moderately dilated.  - Right ventricle: Systolic function was normal.  - Pulmonary arteries: Systolic pressure was within the normal  range.   Pt asymptomatic regarding Ao root (stable at 4cm)   Neuro/Psych    GI/Hepatic   Endo/Other    Renal/GU      Musculoskeletal   Abdominal   Peds  Hematology   Anesthesia Other Findings Past Medical History: 05/18/2015: Chalazion of left upper eyelid 08/17/2009: Dermatophytosis of nail     Comment:  Qualifier: Diagnosis of  By: Dayton Martes MD, Talia   No date: Hyperlipidemia No date: Hypertension     Comment:  stage I No date: Hypertrophic cardiomyopathy (HCC) No date: Hypogonadism male 04/13/2016: Influenza 05/03/2012: Palpitations  Past Surgical History: No date: APPENDECTOMY No date: HERNIA REPAIR No date: TONSILLECTOMY     Reproductive/Obstetrics                             Anesthesia Physical Anesthesia Plan  ASA: III  Anesthesia Plan: General   Post-op Pain Management:    Induction: Intravenous  PONV Risk Score and Plan: Treatment may vary due to age or medical condition and TIVA  Airway Management Planned: Nasal Cannula and Natural Airway  Additional Equipment:   Intra-op Plan:   Post-operative Plan:   Informed Consent: I have reviewed the patients History and Physical, chart, labs and discussed the procedure including the risks, benefits and alternatives for the proposed anesthesia with the patient or authorized representative who has indicated his/her understanding and acceptance.     Dental Advisory Given  Plan Discussed with: CRNA  Anesthesia Plan Comments:        Anesthesia Quick Evaluation

## 2019-07-11 NOTE — H&P (Signed)
Wyline Mood, MD [address], [place], [place], [address] [address], [place], [place], [address] Phone: [phone]  Fax: [phone]  Primary Care Physician:  Doreene Nest, NP   Pre-Procedure History & Physical: HPI:  Tony Murphy is a 49 y.o. male is here for an colonoscopy.   Past Medical History:  Diagnosis Date  . Chalazion of left upper eyelid 05/18/2015  . Dermatophytosis of nail 08/17/2009   Qualifier: Diagnosis of  By: Dayton Martes MD, Jovita Gamma    . Hyperlipidemia   . Hypertension    stage I  . Hypertrophic cardiomyopathy (HCC)   . Hypogonadism male   . Influenza 04/13/2016  . Palpitations 05/03/2012    Past Surgical History:  Procedure Laterality Date  . APPENDECTOMY    . HERNIA REPAIR    . TONSILLECTOMY      Prior to Admission medications   Medication Sig Start Date End Date Taking? Authorizing Provider  amLODipine (NORVASC) 5 MG tablet Take 1 tablet (5 mg total) by mouth daily. 09/11/18  Yes Iran Ouch, MD  carvedilol (COREG) 6.25 MG tablet Take 1 tablet (6.25 mg total) by mouth 2 (two) times daily. 09/11/18  Yes Iran Ouch, MD    Allergies as of 06/21/2019  . (No Known Allergies)    Family History  Problem Relation Age of Onset  . Heart attack Father   . Hypertension Father        Heart Attack  . Heart disease Maternal Grandmother   . Atrial fibrillation Mother   . Heart attack Maternal Grandfather   . Heart attack Paternal Grandfather        sudden cardiac death  . Stroke Brother     Social History   Socioeconomic History  . Marital status: Married    Spouse name: Not on file  . Number of children: Not on file  . Years of education: Not on file  . Highest education level: Not on file  Occupational History  . Occupation: Human Resoures consult    Employer: [employer]  Tobacco Use  . Smoking status: Never Smoker  . Smokeless tobacco: Never Used  Substance and Sexual Activity  . Alcohol use:  Yes    Comment: occasional  . Drug use: No  . Sexual activity: Not on file  Other Topics Concern  . Not on file  Social History Narrative  . Not on file   Social Determinants of Health   Financial Resource Strain:   . Difficulty of Paying Living Expenses:   Food Insecurity:   . Worried About Programme researcher, broadcasting/film/video in the Last Year:   . Barista in the Last Year:   Transportation Needs:   . Freight forwarder (Medical):   Marland Kitchen Lack of Transportation (Non-Medical):   Physical Activity:   . Days of Exercise per Week:   . Minutes of Exercise per Session:   Stress:   . Feeling of Stress :   Social Connections:   . Frequency of Communication with Friends and Family:   . Frequency of Social Gatherings with Friends and Family:   . Attends Religious Services:   . Active Member of Clubs or Organizations:   . Attends Banker Meetings:   Marland Kitchen Marital Status:   Intimate Partner Violence:   . Fear of Current or Ex-Partner:   . Emotionally Abused:   Marland Kitchen Physically Abused:   . Sexually Abused:     Review of  Systems: See HPI, otherwise negative ROS  Physical Exam: BP 132/83   Pulse (!) 43   Temp (!) 96.7 F (35.9 C) (Tympanic)   Resp 18   Ht 5\' 11"  (1.803 m)   Wt 97.5 kg   SpO2 100%   BMI 29.99 kg/m  General:   Alert,  pleasant and cooperative in NAD Head:  Normocephalic and atraumatic. Neck:  Supple; no masses or thyromegaly. Lungs:  Clear throughout to auscultation, normal respiratory effort.    Heart:  +S1, +S2, Regular rate and rhythm, No edema. Abdomen:  Soft, nontender and nondistended. Normal bowel sounds, without guarding, and without rebound.   Neurologic:  Alert and  oriented x4;  grossly normal neurologically.  Impression/Plan: Tony Murphy is here for an colonoscopy to be performed for Screening colonoscopy father had colon polyps  Risks, benefits, limitations, and alternatives regarding  colonoscopy have been reviewed with the patient.   Questions have been answered.  All parties agreeable.   Jonathon Bellows, MD  07/11/2019, 8:09 AM

## 2019-07-11 NOTE — Transfer of Care (Signed)
Immediate Anesthesia Transfer of Care Note  Patient: Tony Murphy  Procedure(s) Performed: COLONOSCOPY WITH PROPOFOL (N/A )  Patient Location: Endoscopy Unit  Anesthesia Type:General  Level of Consciousness: awake  Airway & Oxygen Therapy: Patient connected to nasal cannula oxygen  Post-op Assessment: Post -op Vital signs reviewed and stable  Post vital signs: stable  Last Vitals:  Vitals Value Taken Time  BP 111/61 07/11/19 0832  Temp    Pulse 79 07/11/19 0832  Resp 19 07/11/19 0832  SpO2 99 % 07/11/19 0832  Vitals shown include unvalidated device data.  Last Pain:  Vitals:   07/11/19 0710  TempSrc: Tympanic  PainSc: 0-No pain         Complications: No apparent anesthesia complications

## 2019-07-26 ENCOUNTER — Other Ambulatory Visit
Admission: RE | Admit: 2019-07-26 | Discharge: 2019-07-26 | Disposition: A | Discharge status: Home / Self Care | Payer: 59 | Source: Ambulatory Visit | Attending: Family | Admitting: Family

## 2019-07-26 DIAGNOSIS — E782 Mixed hyperlipidemia: Secondary | ICD-10-CM | POA: Insufficient documentation

## 2019-07-26 LAB — LIPID PANEL
Cholesterol: 182 mg/dL (ref 0–200)
HDL: 40 mg/dL — ABNORMAL LOW (ref >40)
LDL Cholesterol: 121 mg/dL — ABNORMAL HIGH (ref 0–99)
Total CHOL/HDL Ratio: 4.6 RATIO
Triglycerides: 106 mg/dL (ref <150)
VLDL: 21 mg/dL (ref 0–40)

## 2019-09-27 ENCOUNTER — Other Ambulatory Visit: Payer: Self-pay | Admitting: Cardiovascular Disease

## 2019-10-02 ENCOUNTER — Other Ambulatory Visit: Payer: Self-pay

## 2019-10-02 ENCOUNTER — Ambulatory Visit
Admission: RE | Admit: 2019-10-02 | Discharge: 2019-10-02 | Disposition: A | Discharge status: Home / Self Care | Payer: No Typology Code available for payment source | Source: Ambulatory Visit | Attending: Cardiovascular Disease | Admitting: Cardiovascular Disease

## 2019-10-02 DIAGNOSIS — I712 Thoracic aortic aneurysm, without rupture, unspecified: Secondary | ICD-10-CM

## 2019-10-02 LAB — POCT I-STAT CREATININE: Creatinine, Ser: 1 mg/dL (ref 0.61–1.24)

## 2019-10-02 MED ORDER — IOHEXOL 350 MG/ML SOLN
75.0000 mL | Freq: Once | INTRAVENOUS | Status: AC | PRN
  Administered 2019-10-02: 75 mL via INTRAVENOUS

## 2019-10-07 ENCOUNTER — Telehealth: Payer: Self-pay

## 2019-10-07 DIAGNOSIS — I77819 Aortic ectasia, unspecified site: Secondary | ICD-10-CM

## 2019-10-07 NOTE — Telephone Encounter (Signed)
-----   Message from Iran Ouch, MD sent at 10/07/2019  1:35 PM EDT ----- CT showed minimal enlargement of the aortic aneurysm now measuring 4.4 cm.  The plan is to repeat CT scan in 1 year.

## 2019-10-07 NOTE — Telephone Encounter (Signed)
DPR on file. Called to give the patient CT angio results. lmom with results. Patient is to contact the office if any questions. Order placed in Epic for 1 yr repeat CT.

## 2019-10-24 ENCOUNTER — Other Ambulatory Visit: Payer: Self-pay

## 2019-10-24 ENCOUNTER — Ambulatory Visit (INDEPENDENT_AMBULATORY_CARE_PROVIDER_SITE_OTHER): Payer: No Typology Code available for payment source | Admitting: Cardiovascular Disease

## 2019-10-24 ENCOUNTER — Encounter: Payer: Self-pay | Admitting: Cardiovascular Disease

## 2019-10-24 VITALS — BP 120/82 | HR 45 | Ht 71.0 in | Wt 208.2 lb

## 2019-10-24 DIAGNOSIS — I10 Essential (primary) hypertension: Secondary | ICD-10-CM | POA: Diagnosis not present

## 2019-10-24 DIAGNOSIS — I422 Other hypertrophic cardiomyopathy: Secondary | ICD-10-CM | POA: Diagnosis not present

## 2019-10-24 DIAGNOSIS — E785 Hyperlipidemia, unspecified: Secondary | ICD-10-CM | POA: Diagnosis not present

## 2019-10-24 DIAGNOSIS — I712 Thoracic aortic aneurysm, without rupture: Secondary | ICD-10-CM

## 2019-10-24 DIAGNOSIS — I7121 Aneurysm of the ascending aorta, without rupture: Secondary | ICD-10-CM

## 2019-10-24 NOTE — Patient Instructions (Signed)
Medication Instructions:  Your physician recommends that you continue on your current medications as directed. Please refer to the Current Medication list given to you today.  *If you need a refill on your cardiac medications before your next appointment, please call your pharmacy*   Lab Work: None ordered If you have labs (blood work) drawn today and your tests are completely normal, you will receive your results only by: Marland Kitchen MyChart Message (if you have MyChart) OR . A paper copy in the mail If you have any lab test that is abnormal or we need to change your treatment, we will call you to review the results.   Testing/Procedures: None ordered   Follow-Up: At [facility], you and your health needs are our priority.  As part of our continuing mission to provide you with exceptional heart care, we have created designated Provider Care Teams.  These Care Teams include your primary Cardiologist (physician) and Advanced Practice Providers (APPs -  Physician Assistants and Nurse Practitioners) who all work together to provide you with the care you need, when you need it.  We recommend signing up for the patient portal called "MyChart".  Sign up information is provided on this After Visit Summary.  MyChart is used to connect with patients for Virtual Visits (Telemedicine).  Patients are able to view lab/test results, encounter notes, upcoming appointments, etc.  Non-urgent messages can be sent to your provider as well.   To learn more about what you can do with MyChart, go to [email or web address].    Your next appointment:   12 month(s)  The format for your next appointment:   In Person  Provider:    You may see Lorine Bears, MD or one of the following Advanced Practice Providers on your designated Care Team:    Nicolasa Ducking, NP  Eula Listen, PA-C  Marisue Ivan, PA-C    Other Instructions No lifting > 30 lbs.  Avoid the antibiotic class of medications called  Flouroquinolones. The most common prescribed: Levofloxacin Ciprofloxacin Moxifloxacin

## 2019-10-24 NOTE — Progress Notes (Signed)
Cardiology Office Note   Date:  10/24/2019   ID:  Tony Murphy, DOB 1970-03-20, MRN [id number]  PCP:  Doreene Nest, NP  Cardiologist:   Lorine Bears, MD   Chief Complaint  Patient presents with  . OTHER    6 month f/u CT. Meds reviewed verbally with pt.      History of Present Illness: Tony Murphy is a 49 y.o. male who presents for a follow up visit regarding hypertrophic cardiomyopathy and mildly dilated aortic root and ascending aorta. Previous cardiac MRI showed maximal thickness of the basal septum of 2.2 cm.  aortic root was 37 mm and ascending aorta was 41 mm Holter monitor in the past  showed occasional PVCs but no other arrhythmias. There was no evidence of atrial fibrillation or nonsustained ventricular tachycardia. Total PVC count was only 203. His symptoms correlated with PVCs. He was evaluated by Dr. Ladona Ridgel who felt that he was at low risk for malignant arrhythmia.   Most recent echocardiogram in April 2019 showed normal LV systolic function with moderate asymmetric of hypertrophy of the septum.  The aortic root was 4.2 cm and the ascending aorta was 4 cm.  There was mild mitral regurgitation.  Left atrium was moderately dilated.  He had recent CTA which showed slight enlargement of ascending aortic aneurysm to 44 mm.  Last year, Toprol was changed to carvedilol for better blood pressure control.  He has been doing well with no recent chest pain, shortness of breath or palpitations.  He significantly improved his diet and started doing aerobic exercises on regular basis.  He lost about 30 pounds and feels very well.    Past Medical History:  Diagnosis Date  . Chalazion of left upper eyelid 05/18/2015  . Dermatophytosis of nail 08/17/2009   Qualifier: Diagnosis of  By: Dayton Martes MD, Jovita Gamma    . Hyperlipidemia   . Hypertension    stage I  . Hypertrophic cardiomyopathy (HCC)   . Hypogonadism male   . Influenza 04/13/2016  . Palpitations 05/03/2012    Past  Surgical History:  Procedure Laterality Date  . APPENDECTOMY    . COLONOSCOPY WITH PROPOFOL N/A 07/11/2019   Procedure: COLONOSCOPY WITH PROPOFOL;  Surgeon: Wyline Mood, MD;  Location: [facility] ENDOSCOPY;  Service: Gastroenterology;  Laterality: N/A;  . HERNIA REPAIR    . TONSILLECTOMY       Current Outpatient Medications  Medication Sig Dispense Refill  . amLODipine (NORVASC) 5 MG tablet Take 1 tablet (5 mg total) by mouth daily. 90 tablet 3  . carvedilol (COREG) 6.25 MG tablet TAKE 1 TABLET BY MOUTH TWICE A DAY 180 tablet 3   No current facility-administered medications for this visit.    Allergies:   Patient has no known allergies.    Social History:  The patient  reports that he has never smoked. He has never used smokeless tobacco. He reports current alcohol use. He reports that he does not use drugs.   Family History:  The patient's family history includes Atrial fibrillation in his mother; Heart attack in his father, maternal grandfather, and paternal grandfather; Heart disease in his maternal grandmother; Hypertension in his father; Stroke in his brother.    ROS:  Please see the history of present illness.   Otherwise, review of systems are positive for none.   All other systems are reviewed and negative.    PHYSICAL EXAM: VS:  BP 120/82 (BP Location: Left Arm, Patient Position: Sitting, Cuff Size: Normal)   Pulse Marland Kitchen)  45   Ht 5\' 11"  (1.803 m)   Wt 208 lb 4 oz (94.5 kg)   SpO2 98%   BMI 29.04 kg/m  , BMI Body mass index is 29.04 kg/m. GEN: Well nourished, well developed, in no acute distress  HEENT: normal  Neck: no JVD, carotid bruits, or masses Cardiac: RRR; no murmurs, rubs, or gallops,no edema  Respiratory:  clear to auscultation bilaterally, normal work of breathing GI: soft, nontender, nondistended, + BS MS: no deformity or atrophy  Skin: warm and dry, no rash Neuro:  Strength and sensation are intact Psych: euthymic mood, full affect   EKG:  EKG is ordered  today. The ekg ordered today demonstrates sinus bradycardia with a heart rate of 45 bpm.   Recent Labs: 04/24/2019: ALT 19; BUN 10; Hemoglobin 15.3; Platelets 318; Potassium 4.9; Sodium 141 10/02/2019: Creatinine, Ser 1.00    Lipid Panel    Component Value Date/Time   CHOL 182 07/26/2019 0850   TRIG 106 07/26/2019 0850   HDL 40 (L) 07/26/2019 0850   CHOLHDL 4.6 07/26/2019 0850   VLDL 21 07/26/2019 0850   LDLCALC 121 (H) 07/26/2019 0850   LDLDIRECT 137 (H) 04/24/2019 0859      Wt Readings from Last 3 Encounters:  10/24/19 208 lb 4 oz (94.5 kg)  07/11/19 215 lb (97.5 kg)  06/05/19 228 lb (103.4 kg)        No flowsheet data found.    ASSESSMENT AND PLAN:  1.  Hypertrophic cardiomyopathy: Completely asymptomatic with no high risk features for malignant arrhythmia or sudden cardiac death.  Echocardiogram in Jul 12, 2017 was stable  2.  Ascending aortic aneurysm: Most recent sheet CTA showed slight enlargement to 44 mm.  Blood pressure is controlled.  I asked him to avoid heavy lifting of more than 30 pounds and also to avoid fluoroquinolones antibiotics repeat CTA in 1 year.   3. Essential hypertension:  Blood pressure is well controlled on current medications.   Disposition:   FU with me in 1 year  Signed,  2020, MD  10/24/2019 9:49 AM    [facility]

## 2019-11-30 ENCOUNTER — Other Ambulatory Visit: Payer: Self-pay | Admitting: Cardiovascular Disease

## 2020-02-29 ENCOUNTER — Other Ambulatory Visit: Payer: Self-pay | Admitting: Cardiovascular Disease

## 2020-06-27 IMAGING — CT CT ANGIOGRAPHY CHEST
3 of 6 series · 18 of 46 positions shown · IV contrast (APPLIED)
Comparison: No prior studies available currently for direct
comparison.

CLINICAL DATA: Ascending thoracic aortic prominence

EXAM:
CT ANGIOGRAPHY CHEST WITH CONTRAST
TECHNIQUE: Multidetector CT imaging of the chest was performed using the
standard protocol during bolus administration of intravenous
contrast. Multiplanar CT image reconstructions and MIPs were
obtained to evaluate the vascular anatomy.
CONTRAST:  75mL OMNIPAQUE IOHEXOL 350 MG/ML SOLN

[Series 4: axial arterial · axial · arterial · 0.79mm/px · z∈[-450,-174]mm · 11 of 112 slices shown]
[im 10/112  lung]
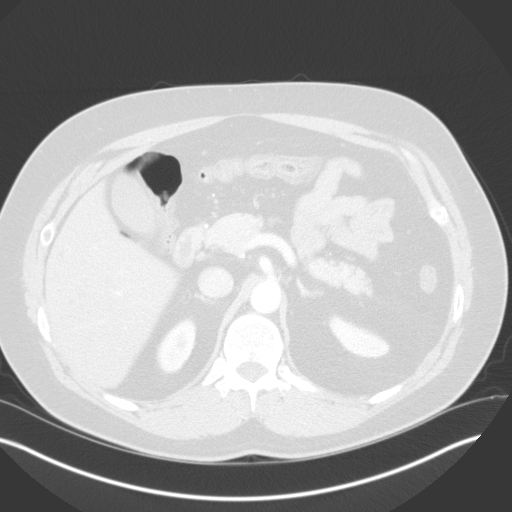
[im 19/112  soft-tissue]
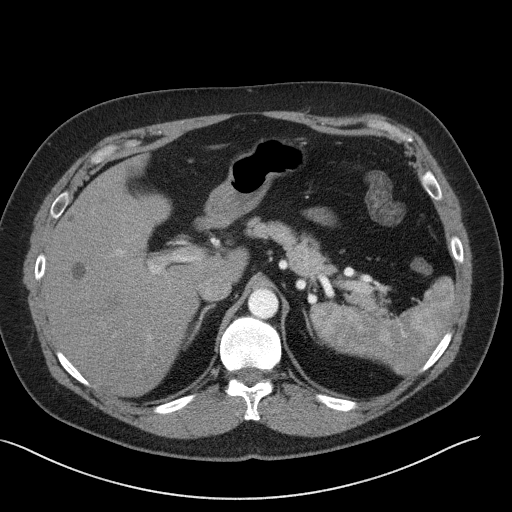
[im 28/112  lung]
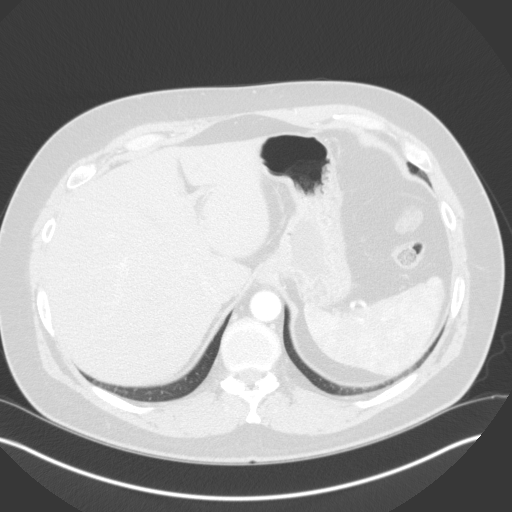
[im 38/112  soft-tissue]
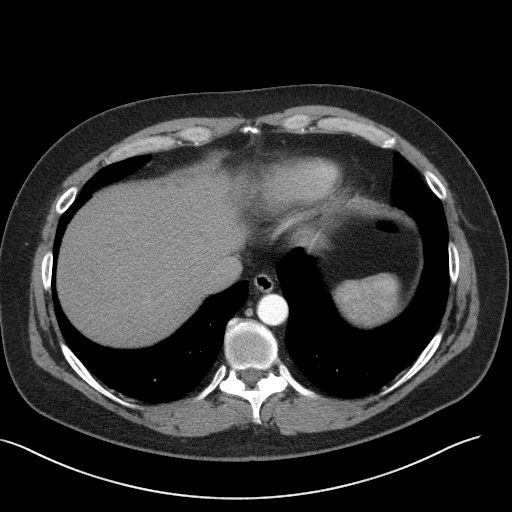
[im 47/112  lung]
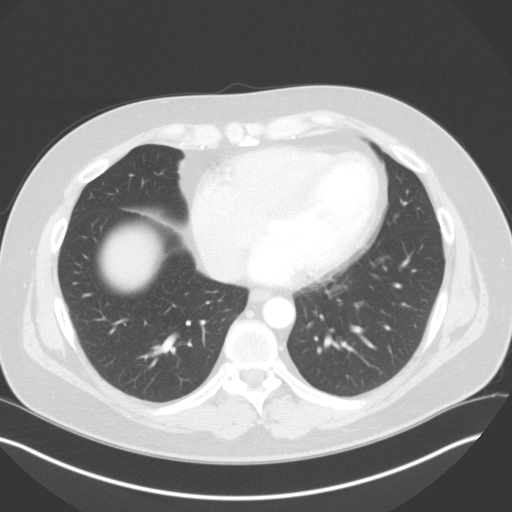
[im 56/112  soft-tissue]
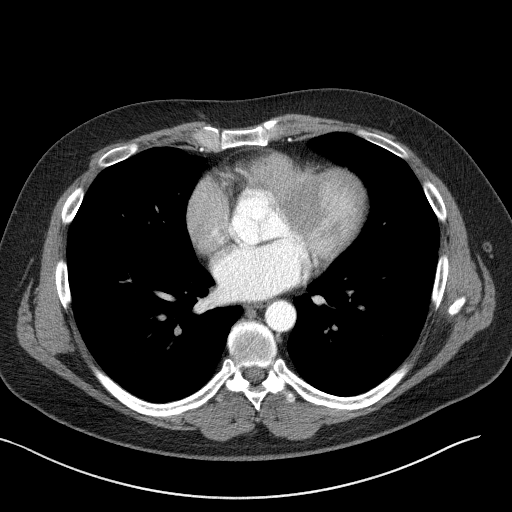
[im 65/112  lung]
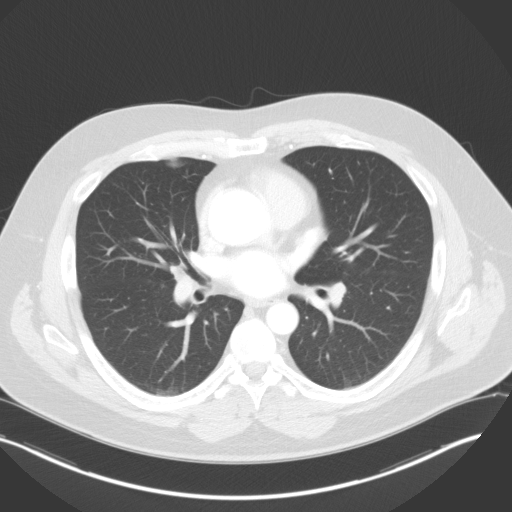
[im 75/112  soft-tissue]
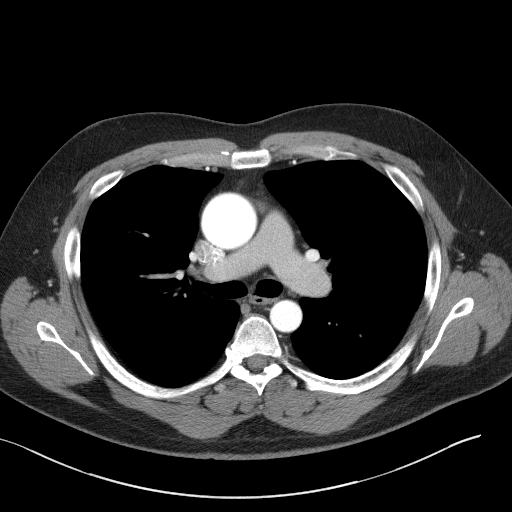
[im 84/112  lung]
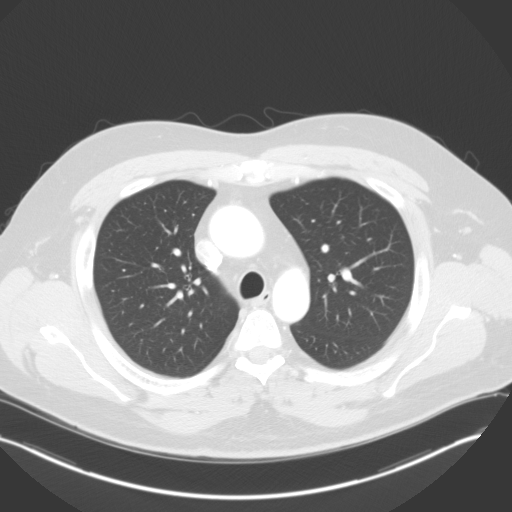
[im 93/112  soft-tissue]
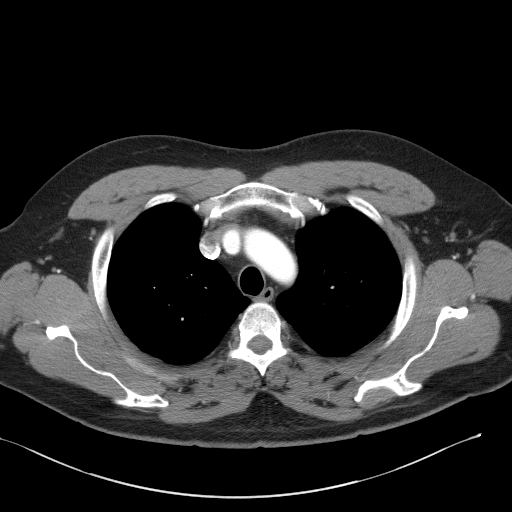
[im 102/112  lung]
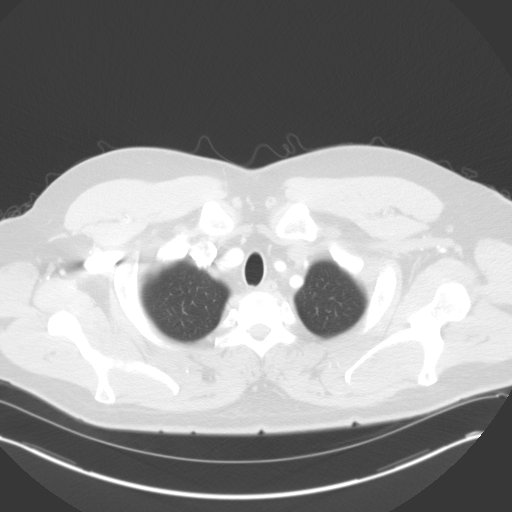

[Series 5: lung · axial · 0.79mm/px · z∈[-440,-330]mm · 4 of 167 slices shown]
[im 19/167  soft-tissue]
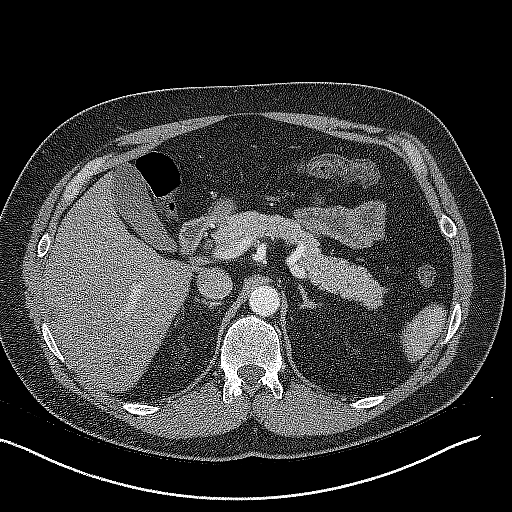
[im 37/167  soft-tissue]
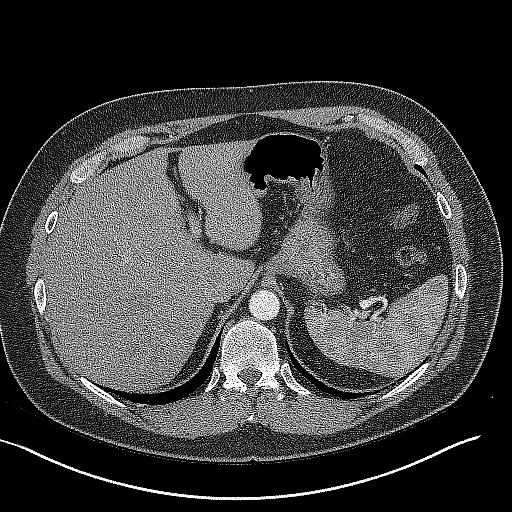
[im 56/167  soft-tissue]
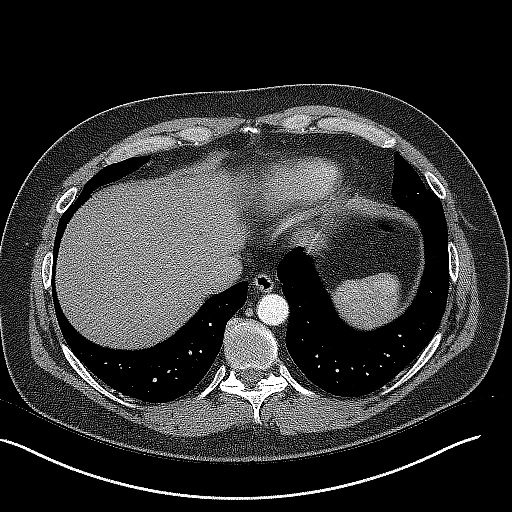
[im 74/167  soft-tissue]
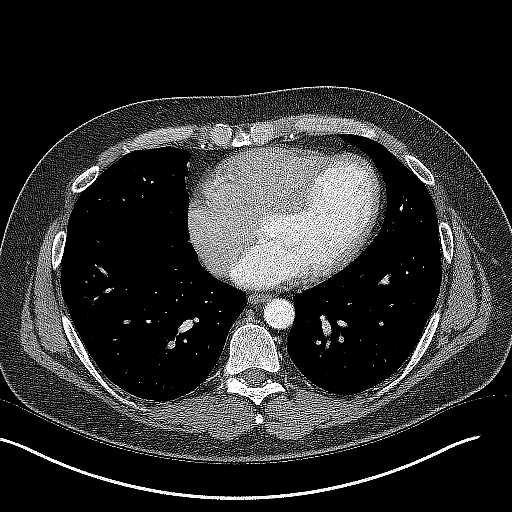

[Series 7: coronal · coronal · 0.72mm/px · 3 of 93 slices shown]
[im 24/93  soft-tissue]
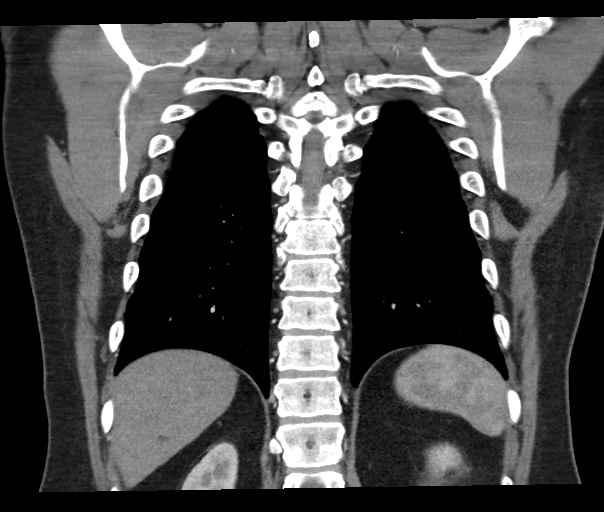
[im 47/93  soft-tissue]
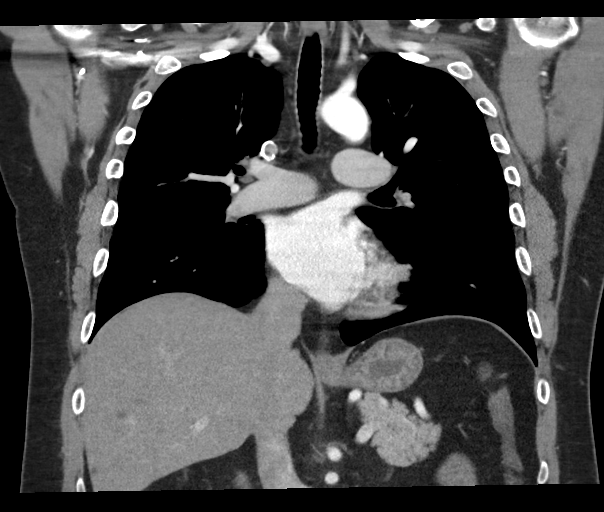
[im 70/93  soft-tissue]
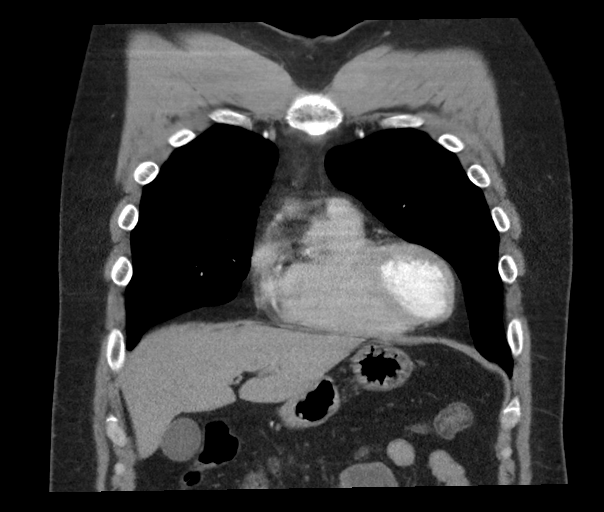

[18 of 46 positions shown; findings below may reference images not displayed]

FINDINGS: Cardiovascular: The measured diameter in the ascending thoracic
aorta is 4.3 x 4.3 cm. There is no evident thoracic aortic
dissection. The measured diameter at the sinotubular junction
performed on coronal images is 4.0 cm. The measured diameter of the
aorta at the arch level is 3.0 cm. The descending thoracic aortic
diameter at the level of the main pulmonary outflow tract is 2.5 x
2.5 cm. Visualized great vessels appear normal. There is no
pericardial effusion or pericardial thickening. There is no
demonstrable pulmonary embolus.

Mediastinum/Nodes: Visualized thyroid appears unremarkable. There is
no evident thoracic adenopathy. No esophageal lesions are
appreciable.

Lungs/Pleura: There is no edema or consolidation. No pleural
effusion or pleural thickening evident.

Upper Abdomen: There are multiple small cystic areas in the liver.
Visualized upper abdominal structures otherwise appear unremarkable.

Musculoskeletal: There are no blastic or lytic bone lesions. No
chest wall lesions evident.

Review of the MIP images confirms the above findings.
IMPRESSION: 1. Ascending thoracic aortic diameter measures 4.3 x 4.3 cm. No
evident thoracic aortic dissection. Recommend annual imaging
followup by CTA or MRA. This recommendation follows 6363
ACCF/AHA/AATS/ACR/ASA/SCA/DALECARNEGIE/FOLTIN/VILBAR/JIA MING Guidelines for the
Diagnosis and Management of Patients with Thoracic Aortic Disease.
Circulation. 6363; 121: E266-e369. Aortic aneurysm NOS
([id number].A).

2.  No demonstrable pulmonary embolus.

3.  Lungs clear.

4.  No demonstrable thoracic adenopathy.

## 2020-08-29 ENCOUNTER — Other Ambulatory Visit: Payer: Self-pay | Admitting: Cardiovascular Disease

## 2020-09-16 ENCOUNTER — Other Ambulatory Visit: Payer: Self-pay

## 2020-09-16 ENCOUNTER — Ambulatory Visit
Admission: RE | Admit: 2020-09-16 | Discharge: 2020-09-16 | Disposition: A | Discharge status: Home / Self Care | Payer: No Typology Code available for payment source | Source: Ambulatory Visit | Attending: Emergency Medicine | Admitting: Emergency Medicine

## 2020-09-16 VITALS — BP 127/84 | Temp 98.1°F | Resp 16

## 2020-09-16 DIAGNOSIS — J209 Acute bronchitis, unspecified: Secondary | ICD-10-CM | POA: Diagnosis not present

## 2020-09-16 DIAGNOSIS — J0100 Acute maxillary sinusitis, unspecified: Secondary | ICD-10-CM | POA: Diagnosis not present

## 2020-09-16 MED ORDER — AZITHROMYCIN 250 MG PO TABS
250.0000 mg | ORAL_TABLET | Freq: Every day | ORAL | 0 refills | Status: DC
Start: 2020-09-16 — End: 2020-10-23

## 2020-09-16 NOTE — ED Provider Notes (Signed)
Tony Murphy    CSN: [id number] Arrival date & time: 09/16/20  9628      History   Chief Complaint Chief Complaint  Patient presents with   Generalized Body Aches   Cough     HPI Tony Murphy is a 50 y.o. male.  Patient presents with 10-day history of congestion, postnasal drip, sinus pressure, cough, body aches.  Treatment at home with Tylenol and Robitussin.  He denies fever, rash, shortness of breath, vomiting, diarrhea, or other symptoms.  His wife had COVID 2 weeks ago.  His medical history includes hypertension, cardiomyopathy, aortic ectasia.  The history is provided by the patient and medical records.   Past Medical History:  Diagnosis Date   Chalazion of left upper eyelid 05/18/2015   Dermatophytosis of nail 08/17/2009   Qualifier: Diagnosis of  By: Dayton Martes MD, Talia     Hyperlipidemia    Hypertension    stage I   Hypertrophic cardiomyopathy [facility])    Hypogonadism male    Influenza 04/13/2016   Palpitations 05/03/2012    Patient Active Problem List   Diagnosis Date Noted   Preventative health care 06/05/2019   Aortic ectasia (HCC) 03/14/2012   Hyperlipidemia LDL goal <100 03/12/2009   Hypertrophic cardiomyopathy (HCC) 03/12/2009   Essential hypertension 12/31/2008    Past Surgical History:  Procedure Laterality Date   APPENDECTOMY     COLONOSCOPY WITH PROPOFOL N/A 07/11/2019   Procedure: COLONOSCOPY WITH PROPOFOL;  Surgeon: Wyline Mood, MD;  Location: [facility] ENDOSCOPY;  Service: Gastroenterology;  Laterality: N/A;   HERNIA REPAIR     TONSILLECTOMY         Home Medications    Prior to Admission medications   Medication Sig Start Date End Date Taking? Authorizing Provider  azithromycin (ZITHROMAX) 250 MG tablet Take 1 tablet (250 mg total) by mouth daily. Take first 2 tablets together, then 1 every day until finished. 09/16/20  Yes Mickie Bail, NP  amLODipine (NORVASC) 5 MG tablet TAKE 1 TABLET BY MOUTH EVERY DAY 08/31/20   Iran Ouch, MD   carvedilol (COREG) 6.25 MG tablet TAKE 1 TABLET BY MOUTH TWICE A DAY 09/27/19   Iran Ouch, MD    Family History Family History  Problem Relation Age of Onset   Heart attack Father    Hypertension Father        Heart Attack   Heart disease Maternal Grandmother    Atrial fibrillation Mother    Heart attack Maternal Grandfather    Heart attack Paternal Grandfather        sudden cardiac death   Stroke Brother     Social History Social History   Tobacco Use   Smoking status: Never   Smokeless tobacco: Never  Vaping Use   Vaping Use: Never used  Substance Use Topics   Alcohol use: Yes    Comment: occasional   Drug use: No     Allergies   Patient has no known allergies.   Review of Systems Review of Systems  Constitutional:  Negative for chills and fever.  HENT:  Positive for congestion, postnasal drip and sinus pressure. Negative for ear pain and sore throat.   Respiratory:  Positive for cough. Negative for shortness of breath.   Cardiovascular:  Negative for chest pain and palpitations.  Gastrointestinal:  Negative for abdominal pain and vomiting.  Musculoskeletal:  Negative for back pain.  Skin:  Negative for color change and rash.  All other systems reviewed and are negative.  Physical Exam Triage Vital Signs ED Triage Vitals  Enc Vitals Group     BP      Pulse      Resp      Temp      Temp src      SpO2      Weight      Height      Head Circumference      Peak Flow      Pain Score      Pain Loc      Pain Edu?      Excl. in GC?    No data found.  Updated Vital Signs BP 127/84 (BP Location: Left Arm)   Temp 98.1 F (36.7 C) (Oral)   Resp 16   SpO2 97%   Visual Acuity Right Eye Distance:   Left Eye Distance:   Bilateral Distance:    Right Eye Near:   Left Eye Near:    Bilateral Near:     Physical Exam Vitals and nursing note reviewed.  Constitutional:      General: He is not in acute distress.    Appearance: He is  well-developed.  HENT:     Head: Normocephalic and atraumatic.     Right Ear: Tympanic membrane normal.     Left Ear: Tympanic membrane normal.     Nose: Congestion and rhinorrhea present.     Mouth/Throat:     Mouth: Mucous membranes are moist.     Pharynx: Oropharynx is clear.  Eyes:     Conjunctiva/sclera: Conjunctivae normal.  Cardiovascular:     Rate and Rhythm: Normal rate and regular rhythm.     Heart sounds: Normal heart sounds.  Pulmonary:     Effort: Pulmonary effort is normal. No respiratory distress.     Breath sounds: Normal breath sounds.  Abdominal:     Palpations: Abdomen is soft.     Tenderness: There is no abdominal tenderness.  Musculoskeletal:     Cervical back: Neck supple.  Skin:    General: Skin is warm and dry.  Neurological:     General: No focal deficit present.     Mental Status: He is alert and oriented to person, place, and time.     Gait: Gait normal.  Psychiatric:        Mood and Affect: Mood normal.        Behavior: Behavior normal.     UC Treatments / Results  Labs (all labs ordered are listed, but only abnormal results are displayed) Labs Reviewed - No data to display  EKG   Radiology No results found.  Procedures Procedures (including critical care time)  Medications Ordered in UC Medications - No data to display  Initial Impression / Assessment and Plan / UC Course  I have reviewed the triage vital signs and the nursing notes.  Pertinent labs & imaging results that were available during my care of the patient were reviewed by me and considered in my medical decision making (see chart for details).  Acute sinusitis, acute bronchitis.  Treating with Zithromax.  Discussed over-the-counter symptom relief.  Instructed patient to follow-up with his PCP if his symptoms are not improving.  He agrees to plan of care.   Final Clinical Impressions(s) / UC Diagnoses   Final diagnoses:  Acute non-recurrent maxillary sinusitis   Acute bronchitis, unspecified organism     Discharge Instructions      Take the antibiotic as directed.  Follow up with your primary care  provider if your symptoms are not improving.         ED Prescriptions     Medication Sig Dispense Auth. Provider   azithromycin (ZITHROMAX) 250 MG tablet Take 1 tablet (250 mg total) by mouth daily. Take first 2 tablets together, then 1 every day until finished. 6 tablet Mickie Bail, NP      PDMP not reviewed this encounter.   Mickie Bail, NP 09/16/20 [phone]

## 2020-09-16 NOTE — ED Triage Notes (Signed)
Patient presents to Urgent Care with complaints of body aches, cough, chest congestion x 10 days. Treating with tylenol and robitussin dm wit some relief.   Denies fever, abdominal pain.

## 2020-09-16 NOTE — Discharge Instructions (Addendum)
Take the antibiotic as directed.  Follow up with your primary care provider if your symptoms are not improving.     

## 2020-09-17 ENCOUNTER — Other Ambulatory Visit: Payer: Self-pay | Admitting: Cardiovascular Disease

## 2020-10-11 ENCOUNTER — Other Ambulatory Visit: Payer: Self-pay | Admitting: Cardiovascular Disease

## 2020-10-23 ENCOUNTER — Ambulatory Visit (INDEPENDENT_AMBULATORY_CARE_PROVIDER_SITE_OTHER): Payer: No Typology Code available for payment source | Admitting: Cardiovascular Disease

## 2020-10-23 ENCOUNTER — Other Ambulatory Visit: Payer: Self-pay

## 2020-10-23 ENCOUNTER — Encounter: Payer: Self-pay | Admitting: Cardiovascular Disease

## 2020-10-23 VITALS — BP 122/80 | HR 51 | Ht 70.0 in | Wt 221.5 lb

## 2020-10-23 DIAGNOSIS — I10 Essential (primary) hypertension: Secondary | ICD-10-CM

## 2020-10-23 DIAGNOSIS — I422 Other hypertrophic cardiomyopathy: Secondary | ICD-10-CM | POA: Diagnosis not present

## 2020-10-23 DIAGNOSIS — I712 Thoracic aortic aneurysm, without rupture: Secondary | ICD-10-CM | POA: Diagnosis not present

## 2020-10-23 DIAGNOSIS — I7121 Aneurysm of the ascending aorta, without rupture: Secondary | ICD-10-CM

## 2020-10-23 NOTE — Patient Instructions (Signed)
Medication Instructions:  Your physician recommends that you continue on your current medications as directed. Please refer to the Current Medication list given to you today.  *If you need a refill on your cardiac medications before your next appointment, please call your pharmacy*   Lab Work: Lipid, Cmp, Cbc today  If you have labs (blood work) drawn today and your tests are completely normal, you will receive your results only by: MyChart Message (if you have MyChart) OR A paper copy in the mail If you have any lab test that is abnormal or we need to change your treatment, we will call you to review the results.   Testing/Procedures: Non-Cardiac CT Angiography (CTA), is a special type of CT scan that uses a computer to produce multi-dimensional views of major blood vessels throughout the body. In CT angiography, a contrast material is injected through an IV to help visualize the blood vessels (Please call [phone] to schedule) [facility] [address] [place], [place]   Follow-Up: At [facility], you and your health needs are our priority.  As part of our continuing mission to provide you with exceptional heart care, we have created designated Provider Care Teams.  These Care Teams include your primary Cardiologist (physician) and Advanced Practice Providers (APPs -  Physician Assistants and Nurse Practitioners) who all work together to provide you with the care you need, when you need it.  We recommend signing up for the patient portal called "MyChart".  Sign up information is provided on this After Visit Summary.  MyChart is used to connect with patients for Virtual Visits (Telemedicine).  Patients are able to view lab/test results, encounter notes, upcoming appointments, etc.  Non-urgent messages can be sent to your provider as well.   To learn more about what you can do with MyChart, go to [email or web address].    Your physician wants you to  follow-up in: 1 year You will receive a reminder letter in the mail two months in advance. If you don't receive a letter, please call our office to schedule the follow-up appointment.   The format for your next appointment:   In Person  Provider:   You may see Lorine Bears, MD or one of the following Advanced Practice Providers on your designated Care Team:   Nicolasa Ducking, NP Eula Listen, PA-C Marisue Ivan, PA-C Cadence Fransico Michael, [place]   Other Instructions N/A

## 2020-10-23 NOTE — Progress Notes (Signed)
Cardiology Office Note   Date:  10/23/2020   ID:  Tony Murphy, DOB 10/17/1970, MRN [id number]  PCP:  Doreene Nest, NP  Cardiologist:   Lorine Bears, MD   Chief Complaint  Patient presents with   Other    12 month f/u no complaints today. Meds reviewed verbally with pt.      History of Present Illness: Tony Murphy is a 50 y.o. male who presents for a follow up visit regarding hypertrophic cardiomyopathy and mildly dilated aortic root and ascending aorta. Previous cardiac MRI showed maximal thickness of the basal septum of 2.2 cm.  aortic root was 37 mm and ascending aorta was 41 mm Holter monitor in the past  showed occasional PVCs but no other arrhythmias. There was no evidence of atrial fibrillation or nonsustained ventricular tachycardia. Total PVC count was only 203. His symptoms correlated with PVCs. He was evaluated by Dr. Ladona Ridgel who felt that he was at low risk for malignant arrhythmia.    Most recent echocardiogram in April 2019 showed normal LV systolic function with moderate asymmetric of hypertrophy of the septum.  The aortic root was 4.2 cm and the ascending aorta was 4 cm.  There was mild mitral regurgitation.  Left atrium was moderately dilated.  Most recent CTA in July 2021showed slight enlargement of ascending aortic aneurysm to 44 mm.    He has been doing well with no chest pain, shortness of breath or palpitations.  He gained some weight compared to last year but wants to resume his program of weight loss as he did before.  Blood pressure has been controlled.   Past Medical History:  Diagnosis Date   Chalazion of left upper eyelid 05/18/2015   Dermatophytosis of nail 08/17/2009   Qualifier: Diagnosis of  By: Dayton Martes MD, Talia     Hyperlipidemia    Hypertension    stage I   Hypertrophic cardiomyopathy [facility])    Hypogonadism male    Influenza 04/13/2016   Palpitations 05/03/2012    Past Surgical History:  Procedure Laterality Date   APPENDECTOMY      COLONOSCOPY WITH PROPOFOL N/A 07/11/2019   Procedure: COLONOSCOPY WITH PROPOFOL;  Surgeon: Wyline Mood, MD;  Location: [facility] ENDOSCOPY;  Service: Gastroenterology;  Laterality: N/A;   HERNIA REPAIR     TONSILLECTOMY       Current Outpatient Medications  Medication Sig Dispense Refill   amLODipine (NORVASC) 5 MG tablet TAKE 1 TABLET BY MOUTH EVERY DAY 90 tablet 0   carvedilol (COREG) 6.25 MG tablet TAKE 1 TABLET (6.25 MG TOTAL) BY MOUTH 2 (TWO) TIMES DAILY. PLEASE SCHEDULE AN APPT FOR REFILLS. 60 tablet 0   No current facility-administered medications for this visit.    Allergies:   Patient has no known allergies.    Social History:  The patient  reports that he has never smoked. He has never used smokeless tobacco. He reports current alcohol use. He reports that he does not use drugs.   Family History:  The patient's family history includes Atrial fibrillation in his mother; Heart attack in his father, maternal grandfather, and paternal grandfather; Heart disease in his maternal grandmother; Hypertension in his father; Stroke in his brother.    ROS:  Please see the history of present illness.   Otherwise, review of systems are positive for none.   All other systems are reviewed and negative.    PHYSICAL EXAM: VS:  BP 122/80 (BP Location: Left Arm, Patient Position: Sitting, Cuff Size: Normal)  Pulse (!) 51   Ht 5\' 10"  (1.778 m)   Wt 221 lb 8 oz (100.5 kg)   SpO2 98%   BMI 31.78 kg/m  , BMI Body mass index is 31.78 kg/m. GEN: Well nourished, well developed, in no acute distress  HEENT: normal  Neck: no JVD, carotid bruits, or masses Cardiac: RRR; no murmurs, rubs, or gallops,no edema  Respiratory:  clear to auscultation bilaterally, normal work of breathing GI: soft, nontender, nondistended, + BS MS: no deformity or atrophy  Skin: warm and dry, no rash Neuro:  Strength and sensation are intact Psych: euthymic mood, full affect   EKG:  EKG is ordered today. The ekg  ordered today demonstrates sinus bradycardia with first-degree AV block and a heart rate of 51 bpm.   Recent Labs: No results found for requested labs within last 8760 hours.    Lipid Panel    Component Value Date/Time   CHOL 182 07/26/2019 0850   TRIG 106 07/26/2019 0850   HDL 40 (L) 07/26/2019 0850   CHOLHDL 4.6 07/26/2019 0850   VLDL 21 07/26/2019 0850   LDLCALC 121 (H) 07/26/2019 0850   LDLDIRECT 137 (H) 04/24/2019 0859      Wt Readings from Last 3 Encounters:  10/23/20 221 lb 8 oz (100.5 kg)  10/24/19 208 lb 4 oz (94.5 kg)  07/11/19 215 lb (97.5 kg)        No flowsheet data found.    ASSESSMENT AND PLAN:  1.  Hypertrophic cardiomyopathy: Completely asymptomatic with no high risk features for malignant arrhythmia or sudden cardiac death.  Echocardiogram in 2017-07-19 was stable   2.  Ascending aortic aneurysm: Most recent sheet CTA showed slight enlargement to 44 mm.  I requested a repeat CTA of the aorta.  3. Essential hypertension:  Blood pressure is well controlled on current medications.  4.  Health maintenance: The patient has not had any labs done in the last year and I requested routine labs on him today.   Disposition:   FU with me in 1 year  Signed,  2020, MD  10/23/2020 8:03 AM    [facility]

## 2020-10-24 LAB — CBC WITH DIFFERENTIAL/PLATELET
Basophils Absolute: 0.1 10*3/uL (ref 0.0–0.2)
Basos: 1 %
EOS (ABSOLUTE): 0.2 10*3/uL (ref 0.0–0.4)
Eos: 4 %
Hematocrit: 44.9 % (ref 37.5–51.0)
Hemoglobin: 15.8 g/dL (ref 13.0–17.7)
Immature Grans (Abs): 0 10*3/uL (ref 0.0–0.1)
Immature Granulocytes: 0 %
Lymphocytes Absolute: 2.4 10*3/uL (ref 0.7–3.1)
Lymphs: 43 %
MCH: 31.7 pg (ref 26.6–33.0)
MCHC: 35.2 g/dL (ref 31.5–35.7)
MCV: 90 fL (ref 79–97)
Monocytes Absolute: 0.6 10*3/uL (ref 0.1–0.9)
Monocytes: 11 %
Neutrophils Absolute: 2.3 10*3/uL (ref 1.4–7.0)
Neutrophils: 41 %
Platelets: 298 10*3/uL (ref 150–450)
RBC: 4.99 x10E6/uL (ref 4.14–5.80)
RDW: 12.9 % (ref 11.6–15.4)
WBC: 5.6 10*3/uL (ref 3.4–10.8)

## 2020-10-24 LAB — COMPREHENSIVE METABOLIC PANEL
ALT: 14 IU/L (ref 0–44)
AST: 42 IU/L — ABNORMAL HIGH (ref 0–40)
Albumin/Globulin Ratio: 1.6 (ref 1.2–2.2)
Albumin: 4.5 g/dL (ref 4.0–5.0)
Alkaline Phosphatase: 86 IU/L (ref 44–121)
BUN/Creatinine Ratio: 16 (ref 9–20)
BUN: 15 mg/dL (ref 6–24)
Bilirubin Total: 0.4 mg/dL (ref 0.0–1.2)
CO2: 22 mmol/L (ref 20–29)
Calcium: 9.6 mg/dL (ref 8.7–10.2)
Chloride: 106 mmol/L (ref 96–106)
Creatinine, Ser: 0.92 mg/dL (ref 0.76–1.27)
Globulin, Total: 2.8 g/dL (ref 1.5–4.5)
Glucose: 80 mg/dL (ref 65–99)
Potassium: 4.6 mmol/L (ref 3.5–5.2)
Sodium: 141 mmol/L (ref 134–144)
Total Protein: 7.3 g/dL (ref 6.0–8.5)
eGFR: 102 mL/min/{1.73_m2} (ref >59)

## 2020-10-24 LAB — LIPID PANEL
Chol/HDL Ratio: 4.1 ratio (ref 0.0–5.0)
Cholesterol, Total: 184 mg/dL (ref 100–199)
HDL: 45 mg/dL (ref >39)
LDL Chol Calc (NIH): 116 mg/dL — ABNORMAL HIGH (ref 0–99)
Triglycerides: 131 mg/dL (ref 0–149)
VLDL Cholesterol Cal: 23 mg/dL (ref 5–40)

## 2020-10-26 NOTE — Addendum Note (Signed)
Addended by: Elnora Quizon C on: 10/26/2020 03:04 PM   Modules accepted: Orders  

## 2020-11-02 ENCOUNTER — Ambulatory Visit
Admission: RE | Admit: 2020-11-02 | Discharge: 2020-11-02 | Disposition: A | Discharge status: Home / Self Care | Payer: No Typology Code available for payment source | Source: Ambulatory Visit | Attending: Cardiovascular Disease | Admitting: Cardiovascular Disease

## 2020-11-02 ENCOUNTER — Other Ambulatory Visit: Payer: Self-pay

## 2020-11-02 DIAGNOSIS — I712 Thoracic aortic aneurysm, without rupture: Secondary | ICD-10-CM | POA: Diagnosis not present

## 2020-11-02 DIAGNOSIS — I7121 Aneurysm of the ascending aorta, without rupture: Secondary | ICD-10-CM

## 2020-11-02 MED ORDER — IOHEXOL 350 MG/ML SOLN
100.0000 mL | Freq: Once | INTRAVENOUS | Status: AC | PRN
  Administered 2020-11-02: 100 mL via INTRAVENOUS

## 2020-11-03 ENCOUNTER — Other Ambulatory Visit: Payer: Self-pay

## 2020-11-03 DIAGNOSIS — I77819 Aortic ectasia, unspecified site: Secondary | ICD-10-CM

## 2020-11-06 ENCOUNTER — Other Ambulatory Visit: Payer: Self-pay | Admitting: Cardiovascular Disease

## 2020-11-23 ENCOUNTER — Other Ambulatory Visit: Payer: Self-pay | Admitting: Cardiovascular Disease

## 2021-02-11 ENCOUNTER — Other Ambulatory Visit: Payer: Self-pay

## 2021-02-11 MED ORDER — CARVEDILOL 6.25 MG PO TABS: ORAL_TABLET | ORAL | 3 refills | Status: DC

## 2021-02-11 MED ORDER — AMLODIPINE BESYLATE 5 MG PO TABS: 5.0000 mg | ORAL_TABLET | Freq: Every day | ORAL | 3 refills | Status: DC

## 2021-04-02 ENCOUNTER — Telehealth: Payer: Self-pay | Admitting: Cardiovascular Disease

## 2021-04-02 MED ORDER — CARVEDILOL 6.25 MG PO TABS: ORAL_TABLET | ORAL | 0 refills | Status: DC

## 2021-04-02 NOTE — Telephone Encounter (Signed)
Requested Prescriptions   Signed Prescriptions Disp Refills   carvedilol (COREG) 6.25 MG tablet 60 tablet 0    Sig: TAKE 1 TABLET (6.25 MG TOTAL) BY MOUTH 2 (TWO) TIMES DAILY.    Authorizing Provider: Lorine Bears A    Ordering User: Thayer Headings, Patterson Hollenbaugh L

## 2021-04-02 NOTE — Telephone Encounter (Signed)
°*  STAT* If patient is at the pharmacy, call can be transferred to refill team.   1. Which medications need to be refilled? (please list name of each medication and dose if known) carvedilol 6.25 MG 1 tablet 2 times daily  2. Which pharmacy/location (including street and city if local pharmacy) is medication to be sent to? [facility] on [address]  3. Do they need a 30 day or 90 day supply? Patient has a short time frame between his local pharmacy and his switch to mail order pharmacy - would like to know if he can just get a temporary 3 week supply

## 2021-05-10 ENCOUNTER — Other Ambulatory Visit: Payer: Self-pay | Admitting: *Deleted

## 2021-05-10 MED ORDER — AMLODIPINE BESYLATE 5 MG PO TABS
5.0000 mg | ORAL_TABLET | Freq: Every day | ORAL | 1 refills | Status: DC
Start: 2021-05-10 — End: 2021-12-08

## 2021-08-12 ENCOUNTER — Ambulatory Visit (INDEPENDENT_AMBULATORY_CARE_PROVIDER_SITE_OTHER): Payer: No Typology Code available for payment source | Admitting: Nurse Practitioner

## 2021-08-12 ENCOUNTER — Encounter: Payer: Self-pay | Admitting: Nurse Practitioner

## 2021-08-12 VITALS — BP 122/82 | HR 62 | Temp 97.3°F | Ht 71.0 in | Wt 245.1 lb

## 2021-08-12 DIAGNOSIS — J0110 Acute frontal sinusitis, unspecified: Secondary | ICD-10-CM | POA: Insufficient documentation

## 2021-08-12 DIAGNOSIS — R0982 Postnasal drip: Secondary | ICD-10-CM

## 2021-08-12 DIAGNOSIS — R051 Acute cough: Secondary | ICD-10-CM | POA: Diagnosis not present

## 2021-08-12 MED ORDER — BENZONATATE 100 MG PO CAPS: 100.0000 mg | ORAL_CAPSULE | Freq: Three times a day (TID) | ORAL | 0 refills | Status: AC | PRN

## 2021-08-12 MED ORDER — AMOXICILLIN-POT CLAVULANATE 875-125 MG PO TABS: 1.0000 | ORAL_TABLET | Freq: Two times a day (BID) | ORAL | 0 refills | Status: AC

## 2021-08-12 MED ORDER — FLUTICASONE PROPIONATE 50 MCG/ACT NA SUSP: 2.0000 | Freq: Every day | NASAL | 0 refills | Status: AC

## 2021-08-12 NOTE — Assessment & Plan Note (Signed)
Likely secondary to postnasal drip.  We will send in Tessalon Perles 100 mg 3 times daily as needed cough.  Did offer patient cough syrup since he is having some difficulty sleeping but he declined

## 2021-08-12 NOTE — Assessment & Plan Note (Signed)
Signs symptoms and exam consistent with sinusitis.  Will prescribe Augmentin 875-125 mg twice daily for 7 days.

## 2021-08-12 NOTE — Patient Instructions (Addendum)
Nice to see you today I have sent in 3 prescriptions to your pharmacy Follow up if you do not improve or symptoms worsen

## 2021-08-12 NOTE — Assessment & Plan Note (Signed)
Apparent on exam.  We will do fluticasone 2 sprays each nostril once daily.  Did give precautions in regards to inciting epistaxis

## 2021-08-12 NOTE — Progress Notes (Signed)
Acute Office Visit  Subjective:     Patient ID: Tony Murphy, male    DOB: 01-02-1971, 51 y.o.   MRN: [id number]  Chief Complaint  Patient presents with   Cough    C/o cough, chest and nasal congestion.  Sxs started 2 wks ago. Tried Mucinex, helpful with phlegm.      Patient is in today for cough  Symptoms approx 2 weeks ago States that the week prior he was under a crawl space and cleaning out dead rodents. Son was sick but only required cough drops and improved Covid vaccine today with J and J No flu vaccine for the past season No covid test Has been taking robitussin for 7 days and mucinex after that without relief  Review of Systems  Constitutional:  Positive for malaise/fatigue. Negative for chills and fever.  HENT:  Positive for congestion and sinus pain. Negative for ear discharge, ear pain and sore throat.        PND  Respiratory:  Positive for cough (mucinex helps. not sure of color). Negative for shortness of breath and wheezing.   Cardiovascular:  Negative for chest pain.  Gastrointestinal:  Negative for abdominal pain, diarrhea, nausea and vomiting.  Musculoskeletal:  Negative for joint pain and myalgias.  Neurological:  Positive for headaches (post tussive headaceh).       Objective:    BP 122/82   Pulse 62   Temp (!) 97.3 F (36.3 C) (Temporal)   Ht 5\' 11"  (1.803 m)   Wt 245 lb 2 oz (111.2 kg)   SpO2 98%   BMI 34.19 kg/m    Physical Exam Vitals and nursing note reviewed.  Constitutional:      Appearance: Normal appearance.  HENT:     Right Ear: Tympanic membrane, ear canal and external ear normal.     Left Ear: Tympanic membrane, ear canal and external ear normal.     Nose:     Right Sinus: Frontal sinus tenderness present. No maxillary sinus tenderness.     Left Sinus: Maxillary sinus tenderness and frontal sinus tenderness present.     Mouth/Throat:     Mouth: Mucous membranes are moist.     Pharynx: Oropharynx is clear.      Comments: Cobble stoning  Cardiovascular:     Rate and Rhythm: Normal rate and regular rhythm.     Heart sounds: Normal heart sounds.  Pulmonary:     Effort: Pulmonary effort is normal.     Breath sounds: Normal breath sounds.  Abdominal:     General: Bowel sounds are normal.  Lymphadenopathy:     Cervical: No cervical adenopathy.  Neurological:     Mental Status: He is alert.    No results found for any visits on 08/12/21.      Assessment & Plan:   Problem List Items Addressed This Visit       Respiratory   Acute non-recurrent frontal sinusitis - Primary    Signs symptoms and exam consistent with sinusitis.  Will prescribe Augmentin 875-125 mg twice daily for 7 days.       Relevant Medications   amoxicillin-clavulanate (AUGMENTIN) 875-125 MG tablet   fluticasone (FLONASE) 50 MCG/ACT nasal spray   benzonatate (TESSALON PERLES) 100 MG capsule     Other   Acute cough    Likely secondary to postnasal drip.  We will send in Tessalon Perles 100 mg 3 times daily as needed cough.  Did offer patient cough syrup since he is having  some difficulty sleeping but he declined       Relevant Medications   benzonatate (TESSALON PERLES) 100 MG capsule   Post-nasal drip    Apparent on exam.  We will do fluticasone 2 sprays each nostril once daily.  Did give precautions in regards to inciting epistaxis       Relevant Medications   fluticasone (FLONASE) 50 MCG/ACT nasal spray    Meds ordered this encounter  Medications   amoxicillin-clavulanate (AUGMENTIN) 875-125 MG tablet    Sig: Take 1 tablet by mouth 2 (two) times daily for 7 days.    Dispense:  14 tablet    Refill:  0    Order Specific Question:   Supervising Provider    Answer:   Glori Bickers MARNE A [[id number]]   fluticasone (FLONASE) 50 MCG/ACT nasal spray    Sig: Place 2 sprays into both nostrils daily.    Dispense:  16 g    Refill:  0    Order Specific Question:   Supervising Provider    Answer:   Glori Bickers MARNE A [[id number]]    benzonatate (TESSALON PERLES) 100 MG capsule    Sig: Take 1 capsule (100 mg total) by mouth 3 (three) times daily as needed for cough.    Dispense:  20 capsule    Refill:  0    Order Specific Question:   Supervising Provider    Answer:   TOWER, MARNE A [[id number]]    Return if symptoms worsen or fail to improve.  Romilda Garret, NP

## 2021-09-03 ENCOUNTER — Other Ambulatory Visit: Payer: Self-pay | Admitting: Nurse Practitioner

## 2021-09-03 DIAGNOSIS — R0982 Postnasal drip: Secondary | ICD-10-CM

## 2021-12-04 ENCOUNTER — Other Ambulatory Visit: Payer: Self-pay | Admitting: Cardiovascular Disease

## 2021-12-06 NOTE — Telephone Encounter (Signed)
Please schedule overdue F/U appt for refills. Thank you! 

## 2021-12-08 NOTE — Telephone Encounter (Signed)
Rx refill sent to pharmacy. 

## 2021-12-24 NOTE — Telephone Encounter (Signed)
Mailbox full, unable to lvm

## 2022-01-08 ENCOUNTER — Other Ambulatory Visit: Payer: Self-pay | Admitting: Cardiovascular Disease

## 2022-01-10 NOTE — Telephone Encounter (Signed)
Please schedule overdue F/U appointment for refills. Thank you! 

## 2022-01-17 ENCOUNTER — Encounter: Payer: Self-pay | Admitting: Medical

## 2022-01-17 ENCOUNTER — Other Ambulatory Visit
Admission: RE | Admit: 2022-01-17 | Discharge: 2022-01-17 | Disposition: A | Discharge status: Home / Self Care | Payer: No Typology Code available for payment source | Source: Ambulatory Visit | Attending: Medical | Admitting: Medical

## 2022-01-17 ENCOUNTER — Ambulatory Visit: Payer: No Typology Code available for payment source | Attending: Medical | Admitting: Medical

## 2022-01-17 VITALS — BP 130/84 | HR 54 | Ht 71.0 in | Wt 252.2 lb

## 2022-01-17 DIAGNOSIS — I422 Other hypertrophic cardiomyopathy: Secondary | ICD-10-CM | POA: Insufficient documentation

## 2022-01-17 DIAGNOSIS — Z01812 Encounter for preprocedural laboratory examination: Secondary | ICD-10-CM

## 2022-01-17 DIAGNOSIS — E785 Hyperlipidemia, unspecified: Secondary | ICD-10-CM

## 2022-01-17 DIAGNOSIS — I10 Essential (primary) hypertension: Secondary | ICD-10-CM | POA: Insufficient documentation

## 2022-01-17 DIAGNOSIS — I517 Cardiomegaly: Secondary | ICD-10-CM | POA: Insufficient documentation

## 2022-01-17 DIAGNOSIS — I7121 Aneurysm of the ascending aorta, without rupture: Secondary | ICD-10-CM | POA: Diagnosis not present

## 2022-01-17 DIAGNOSIS — I77819 Aortic ectasia, unspecified site: Secondary | ICD-10-CM

## 2022-01-17 LAB — CBC
HCT: 44.6 % (ref 39.0–52.0)
Hemoglobin: 15.8 g/dL (ref 13.0–17.0)
MCH: 30.3 pg (ref 26.0–34.0)
MCHC: 35.4 g/dL (ref 30.0–36.0)
MCV: 85.6 fL (ref 80.0–100.0)
Platelets: 234 10*3/uL (ref 150–400)
RBC: 5.21 MIL/uL (ref 4.22–5.81)
RDW: 11.4 % — ABNORMAL LOW (ref 11.5–15.5)
WBC: 7.4 10*3/uL (ref 4.0–10.5)
nRBC: 0 % (ref 0.0–0.2)

## 2022-01-17 LAB — LIPID PANEL
Cholesterol: 219 mg/dL — ABNORMAL HIGH (ref 0–200)
HDL: 40 mg/dL — ABNORMAL LOW (ref >40)
LDL Cholesterol: 136 mg/dL — ABNORMAL HIGH (ref 0–99)
Total CHOL/HDL Ratio: 5.5 RATIO
Triglycerides: 213 mg/dL — ABNORMAL HIGH (ref <150)
VLDL: 43 mg/dL — ABNORMAL HIGH (ref 0–40)

## 2022-01-17 LAB — COMPREHENSIVE METABOLIC PANEL
ALT: 25 U/L (ref 0–44)
AST: 47 U/L — ABNORMAL HIGH (ref 15–41)
Albumin: 4.2 g/dL (ref 3.5–5.0)
Alkaline Phosphatase: 83 U/L (ref 38–126)
Anion gap: 6 (ref 5–15)
BUN: 12 mg/dL (ref 6–20)
CO2: 26 mmol/L (ref 22–32)
Calcium: 9.1 mg/dL (ref 8.9–10.3)
Chloride: 108 mmol/L (ref 98–111)
Creatinine, Ser: 0.96 mg/dL (ref 0.61–1.24)
GFR, Estimated: >60 mL/min (ref >60)
Glucose, Bld: 93 mg/dL (ref 70–99)
Potassium: 4 mmol/L (ref 3.5–5.1)
Sodium: 140 mmol/L (ref 135–145)
Total Bilirubin: 0.9 mg/dL (ref 0.3–1.2)
Total Protein: 7.7 g/dL (ref 6.5–8.1)

## 2022-01-17 MED ORDER — AMLODIPINE BESYLATE 5 MG PO TABS: 5.0000 mg | ORAL_TABLET | Freq: Every day | ORAL | 3 refills | Status: AC

## 2022-01-17 NOTE — Patient Instructions (Addendum)
Medication Instructions:  - Your physician recommends that you continue on your current medications as directed. Please refer to the Current Medication list given to you today.  *If you need a refill on your cardiac medications before your next appointment, please call your pharmacy*   Lab Work: - Your physician recommends that you have lab work today:  Lipid/ Direct LDL/ CBC/ CMP  Nature conservation officer at [facility] 1st desk on the right to check in (REGISTRATION)  Lab hours: Monday- Friday (7:30 am- 5:30 pm)   If you have labs (blood work) drawn today and your tests are completely normal, you will receive your results only by: MyChart Message (if you have MyChart) OR A paper copy in the mail If you have any lab test that is abnormal or we need to change your treatment, we will call you to review the results.   Testing/Procedures:  1) Echocardiogram:  Thursday 01/20/22 @ 9:30 am - Your physician has requested that you have an echocardiogram. Echocardiography is a painless test that uses sound waves to create images of your heart. It provides your doctor with information about the size and shape of your heart and how well your heart's chambers and valves are working. This procedure takes approximately one hour. There are no restrictions for this procedure. There is a possibility that an IV may need to be started during your test to inject an image enhancing agent. This is done to obtain more optimal pictures of your heart. Therefore we ask that you do at least drink some water prior to coming in to hydrate your veins.   Please do NOT wear cologne, perfume, aftershave, or lotions (deodorant is allowed). Please arrive 15 minutes prior to your appointment time.   2) CT angiogram of the chest/ aorta: Thursday 01/20/22 @ 1:45 pm - Non-Cardiac CT Angiography (CTA), is a special type of CT scan that uses a computer to produce multi-dimensional views of major blood vessels throughout the body. In CT  angiography, a contrast material is injected through an IV to help visualize the blood vessels   Follow-Up: At [facility], you and your health needs are our priority.  As part of our continuing mission to provide you with exceptional heart care, we have created designated Provider Care Teams.  These Care Teams include your primary Cardiologist (physician) and Advanced Practice Providers (APPs -  Physician Assistants and Nurse Practitioners) who all work together to provide you with the care you need, when you need it.  We recommend signing up for the patient portal called "MyChart".  Sign up information is provided on this After Visit Summary.  MyChart is used to connect with patients for Virtual Visits (Telemedicine).  Patients are able to view lab/test results, encounter notes, upcoming appointments, etc.  Non-urgent messages can be sent to your provider as well.   To learn more about what you can do with MyChart, go to [email or web address].    Your next appointment:   pending test results/ your move  The format for your next appointment:   In Person  Provider:   You may see Kathlyn Sacramento, MD or one of the following Advanced Practice Providers on your designated Care Team:    Cadence Kathlen Mody, [place]   Other Instructions  Echocardiogram An echocardiogram is a test that uses sound waves (ultrasound) to produce images of the heart. Images from an echocardiogram can provide important information about: Heart size and shape. The size and thickness and movement of your heart's walls.  Heart muscle function and strength. Heart valve function or if you have stenosis. Stenosis is when the heart valves are too narrow. If blood is flowing backward through the heart valves (regurgitation). A tumor or infectious growth around the heart valves. Areas of heart muscle that are not working well because of poor blood flow or injury from a heart attack. Aneurysm detection. An aneurysm is a  weak or damaged part of an artery wall. The wall bulges out from the normal force of blood pumping through the body. Tell a health care provider about: Any allergies you have. All medicines you are taking, including vitamins, herbs, eye drops, creams, and over-the-counter medicines. Any blood disorders you have. Any surgeries you have had. Any medical conditions you have. Whether you are pregnant or may be pregnant. What are the risks? Generally, this is a safe test. However, problems may occur, including an allergic reaction to dye (contrast) that may be used during the test. What happens before the test? No specific preparation is needed. You may eat and drink normally. What happens during the test?  You will take off your clothes from the waist up and put on a hospital gown. Electrodes or electrocardiogram (ECG)patches may be placed on your chest. The electrodes or patches are then connected to a device that monitors your heart rate and rhythm. You will lie down on a table for an ultrasound exam. A gel will be applied to your chest to help sound waves pass through your skin. A handheld device, called a transducer, will be pressed against your chest and moved over your heart. The transducer produces sound waves that travel to your heart and bounce back (or "echo" back) to the transducer. These sound waves will be captured in real-time and changed into images of your heart that can be viewed on a video monitor. The images will be recorded on a computer and reviewed by your health care provider. You may be asked to change positions or hold your breath for a short time. This makes it easier to get different views or better views of your heart. In some cases, you may receive contrast through an IV in one of your veins. This can improve the quality of the pictures from your heart. The procedure may vary among health care providers and hospitals. What can I expect after the test? You may return to  your normal, everyday life, including diet, activities, and medicines, unless your health care provider tells you not to do that. Follow these instructions at home: It is up to you to get the results of your test. Ask your health care provider, or the department that is doing the test, when your results will be ready. Keep all follow-up visits. This is important. Summary An echocardiogram is a test that uses sound waves (ultrasound) to produce images of the heart. Images from an echocardiogram can provide important information about the size and shape of your heart, heart muscle function, heart valve function, and other possible heart problems. You do not need to do anything to prepare before this test. You may eat and drink normally. After the echocardiogram is completed, you may return to your normal, everyday life, unless your health care provider tells you not to do that. This information is not intended to replace advice given to you by your health care provider. Make sure you discuss any questions you have with your health care provider. Document Revised: 11/11/2020 Document Reviewed: 10/22/2019 Elsevier Patient Education  2023 [employer].  CT Angiogram A CT angiogram is a painless procedure to look at the blood vessels in various areas of the body. For this procedure, a large X-ray machine, called a CT scanner, takes detailed pictures of blood vessels that have been injected with a contrast dye. A CT angiogram allows your health care provider to see how well blood is flowing to the area of your body that is being checked. Your health care provider will be able to see if there are any problems, such as a blockage. Tell a health care provider about: Any allergies you have. This is especially important if you have had a previous allergic reaction to contrast dye or iodine. All medicines you are taking, including vitamins, herbs, eye drops, creams, and over-the-counter medicines. Any  bleeding problems you have. Any medical conditions you have. Any surgeries you have had. Whether you are pregnant or may be pregnant. Whether you are breastfeeding. Any anxiety disorders, long-term (chronic) pain, or other conditions you have that may increase your stress or prevent you from lying still. What are the risks? Generally, this is a safe procedure. However, problems may occur, including: Infection. Bleeding or pain at the IV insertion site. Allergic reactions to medicines or dyes. Damage to other structures or organs. This may include kidney damage from the contrast dye that is used. Increased risk of cancer from radiation exposure. This risk is low. Talk with your health care provider about: The risks and benefits of testing. How you can receive the lowest dose of radiation. What happens before the procedure? Wear comfortable clothing and remove any jewelry, glasses, dentures, and hearing aids. Follow instructions from your health care provider about eating and drinking. These may include not eating or drinking for a few hours before the procedure. The contrast dye can cause nausea, but this is less likely if your stomach is empty. Ask your health care provider about changing or stopping your regular medicines. This is especially important if you are taking diabetes medicines or blood thinners. What happens during the procedure?  An IV will be inserted into one of your veins. You will be asked to lie on an exam table. This table will slide in and out of the CT machine during the procedure. Contrast dye will be injected into the IV. You might feel: Warm or flushed. A metallic taste in your mouth. That you have to urinate. The person running the machine will give you instructions while the scans are being done. You may be asked to: Keep your arms above your head. Hold your breath. Stay very still, even if the table is moving. When the scanning is complete, you will be moved  out of the machine. The IV will be removed. The procedure may vary among health care providers and hospitals. What can I expect after the procedure? For a few minutes after the procedure, it is common to have: A metallic taste in your mouth from the contrast dye. A feeling of warmth. It is up to you to get the results of your procedure. Ask your health care provider, or the department that is doing the procedure, when your results will be ready. Follow these instructions at home: Activity If you were given a sedative during the procedure, it can affect you for several hours. Do not drive or operate machinery until your health care provider says that it is safe. Return to your normal activities as told by your health care provider. Ask your health care provider what activities are safe for  you. General instructions Check your IV insertion area every day for signs of infection. Check for: Redness, swelling, or pain. Fluid or blood. Warmth. Pus or a bad smell. Take over-the-counter and prescription medicines only as told by your health care provider. If told, drink enough fluid to keep your urine pale yellow. This will help to flush the contrast dye out of your body. Keep all follow-up visits. This is important. Contact a health care provider if: You have any symptoms of allergy to the contrast dye. These include: Shortness of breath. Rash or itchy, red, swollen areas of skin (hives). A racing heartbeat. You have redness, swelling, warmth, pus, or pain around the IV insertion site. Summary A CT angiogram is a procedure to look at the blood vessels in various areas of the body. Tell your health care provider about any allergies you have. This is especially important if you have had a previous allergic reaction to contrast dye or iodine. The IV contrast dye can make you feel warm, flushed, or the need to urinate. You may also have a metallic taste in your mouth. These feelings may last for a  few minutes. After the procedure, if told, drink enough fluid to keep your urine pale yellow. This will help to flush the contrast dye out of your body. Contact a health care provider if you have any symptoms of allergy to the contrast dye. This information is not intended to replace advice given to you by your health care provider. Make sure you discuss any questions you have with your health care provider. Document Revised: 11/11/2020 Document Reviewed: 07/14/2020 Elsevier Patient Education  2023 Elsevier Inc.  Important Information About Sugar

## 2022-01-17 NOTE — Progress Notes (Signed)
Cardiology Office Note:    Date:  01/17/2022   ID:  Tony Murphy, DOB [birth year], MRN [id number]  PCP:  Pleas Koch, NP  [facility] Cardiologist:  Kathlyn Sacramento, MD  [facility] Electrophysiologist:  None   Referring MD: Pleas Koch, NP   Chief Complaint: 12 month follow-up  History of Present Illness:    Tony Murphy is a 51 y.o. male with a hx of hypertrophic CM, mildly dilated aortic root and ascending aorta who presents for 12 month follow-up.  Previsou cardiac MRI showed maxiaml thickness of the basal septum of 2.2cm, aortic root was 45mm and ascending aorta was 10mm.   Holter monitor in the past showed occasional PVCs but no other arrythmias.   Today, the patient is overall doing well from a cardiac perspective. Patient does no formal activity. Him and his wife are in the process of moving to another state. They are moving to [facility] at the end of the month. He will need to find a cardiologist over there. Patient denies chest pain or SOB. No lower leg edema, orthopnea or pnd.   Past Medical History:  Diagnosis Date   Chalazion of left upper eyelid 05/18/2015   Dermatophytosis of nail 08/17/2009   Qualifier: Diagnosis of  By: Deborra Medina MD, Talia     Hyperlipidemia    Hypertension    stage I   Hypertrophic cardiomyopathy [facility])    Hypogonadism male    Influenza 04/13/2016   Palpitations 05/03/2012    Past Surgical History:  Procedure Laterality Date   APPENDECTOMY     COLONOSCOPY WITH PROPOFOL N/A 07/11/2019   Procedure: COLONOSCOPY WITH PROPOFOL;  Surgeon: Jonathon Bellows, MD;  Location: [facility] ENDOSCOPY;  Service: Gastroenterology;  Laterality: N/A;   HERNIA REPAIR     TONSILLECTOMY      Current Medications: Current Meds  Medication Sig   amLODipine (NORVASC) 5 MG tablet Take 1 tablet (5 mg total) by mouth daily. Please contact our office to schedule an overdue appointment for any future refills, thank you. [phone]. 1st attempt    carvedilol (COREG) 6.25 MG tablet TAKE 1 TABLET (6.25 MG TOTAL) BY MOUTH 2 (TWO) TIMES DAILY.     Allergies:   Patient has no known allergies.   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    Employer: [employer]  Tobacco Use   Smoking status: Never   Smokeless tobacco: Never  Vaping Use   Vaping Use: Never used  Substance and Sexual Activity   Alcohol use: Yes    Comment: occasional   Drug use: No   Sexual activity: Not on file  Other Topics Concern   Not on file  Social History Narrative   Not on file   Social Determinants of Health   Financial Resource Strain: Not on file  Food Insecurity: Not on file  Transportation Needs: Not on file  Physical Activity: Not on file  Stress: Not on file  Social Connections: Not on file     Family History: The patient's family history includes Atrial fibrillation in his mother; Heart attack in his father, maternal grandfather, and paternal grandfather; Heart disease in his maternal grandmother; Hypertension in his father; Stroke in his brother.  ROS:  Please see the history of present illness.     All other systems reviewed and are negative.  EKGs/Labs/Other Studies Reviewed:    The following studies were reviewed today:  Echo 06/2017 Study Conclusions   - Left ventricle: The cavity size was normal. There was moderate    asymmetric hypertrophy of the septum Systolic function was    normal. The estimated ejection fraction was in the range of 55%    to 60%. Wall motion was normal; there were no regional wall    motion abnormalities. Left ventricular diastolic function    parameters were normal.  - Aortic root: The aortic root was mild to moderately dilated 4.2    cm  - Ascending aorta: The ascending  aorta was mildly dilated, 4.0 cm  - Mitral valve: There was mild regurgitation.  - Left atrium: The atrium was moderately dilated.  - Right ventricle: Systolic function was normal.  - Pulmonary arteries: Systolic pressure was within the normal    range.    EKG:  EKG is ordered today.  The ekg ordered today demonstrates SB 54bpm, LAD, no significant changes  Recent Labs: No results found for requested labs within last 365 days.  Recent Lipid Panel    Component Value Date/Time   CHOL 184 10/23/2020 0817   TRIG 131 10/23/2020 0817   HDL 45 10/23/2020 0817   CHOLHDL 4.1 10/23/2020 0817   CHOLHDL 4.6 07/26/2019 0850   VLDL 21 07/26/2019 0850   LDLCALC 116 (H) 10/23/2020 0817   LDLDIRECT 137 (H) 04/24/2019 0859   Physical Exam:    VS:  BP 130/84 (BP Location: Left Arm, Patient Position: Sitting, Cuff Size: Normal)   Pulse (!) 54   Ht 5\' 11"  (1.803 m)   Wt 252 lb 3.2 oz (114.4 kg)   SpO2 97%   BMI 35.17 kg/m     Wt Readings from Last 3 Encounters:  01/17/22 252 lb 3.2 oz (114.4 kg)  08/12/21 245 lb 2 oz (111.2 kg)  10/23/20 221 lb 8 oz (100.5 kg)     GEN:  Well nourished, well developed in no acute distress HEENT: Normal NECK: No JVD; No carotid bruits LYMPHATICS: No lymphadenopathy CARDIAC: RRR, no murmurs, rubs, gallops RESPIRATORY:  Clear to auscultation without rales, wheezing or rhonchi  ABDOMEN: Soft, non-tender, non-distended MUSCULOSKELETAL:  No edema; No deformity  SKIN: Warm and dry NEUROLOGIC:  Alert and oriented x 3 PSYCHIATRIC:  Normal affect   ASSESSMENT:    1. Hypertrophic cardiomyopathy (HCC)   2. Ascending aortic aneurysm, unspecified whether ruptured (HCC)   3. Essential hypertension    PLAN:    In order of problems listed above:  Hypertrophic CM Echo in 2019 showed LVEF 55-60% moderate asymmetric hypertrophy. I will repeat an echo. Given that the patient is moving, we may not have availability in the next 3 weeks. His next cardiologist can  order it once he moves, if this is the case.   Ascending aortic aneurysm CTA chest/aorta showed aneurysm up to 4.5 cm. Plan for repeat imaging yearly. Again, we will order it and check availability. He may need it ordered once he moves.  HTN BP is good today. Continue current medications. I will refill amlodipine. Continue amlodipine 5mg  daily and Coreg 6.25mg  BID.   Disposition: Follow up prn with MD/APP    Signed, Missael Ferrari 2020, PA-C  01/17/2022 3:56 PM    [facility]

## 2022-01-18 LAB — LDL CHOLESTEROL, DIRECT: Direct LDL: 143 mg/dL — ABNORMAL HIGH (ref 0–99)

## 2022-01-20 ENCOUNTER — Ambulatory Visit
Admission: RE | Admit: 2022-01-20 | Discharge: 2022-01-20 | Disposition: A | Discharge status: Home / Self Care | Payer: No Typology Code available for payment source | Source: Ambulatory Visit | Attending: Medical | Admitting: Medical

## 2022-01-20 ENCOUNTER — Other Ambulatory Visit: Payer: Self-pay

## 2022-01-20 ENCOUNTER — Ambulatory Visit: Payer: No Typology Code available for payment source | Attending: Medical

## 2022-01-20 DIAGNOSIS — I422 Other hypertrophic cardiomyopathy: Secondary | ICD-10-CM | POA: Diagnosis not present

## 2022-01-20 DIAGNOSIS — E785 Hyperlipidemia, unspecified: Secondary | ICD-10-CM

## 2022-01-20 DIAGNOSIS — I7121 Aneurysm of the ascending aorta, without rupture: Secondary | ICD-10-CM | POA: Diagnosis present

## 2022-01-20 DIAGNOSIS — I517 Cardiomegaly: Secondary | ICD-10-CM | POA: Diagnosis not present

## 2022-01-20 LAB — ECHOCARDIOGRAM COMPLETE
AR max vel: 4.19 cm2
AV Area VTI: 2.87 cm2
AV Area mean vel: 3.11 cm2
AV Mean grad: 3 mmHg
AV Peak grad: 4.8 mmHg
AV Vena cont: 0.4 cm
Ao pk vel: 1.09 m/s
Area-P 1/2: 3.43 cm2
Calc EF: 62.2 %
S' Lateral: 3.3 cm
Single Plane A2C EF: 60.6 %
Single Plane A4C EF: 64.3 %

## 2022-01-20 MED ORDER — IOHEXOL 350 MG/ML SOLN
75.0000 mL | Freq: Once | INTRAVENOUS | Status: AC | PRN
  Administered 2022-01-20: 75 mL via INTRAVENOUS

## 2022-01-22 ENCOUNTER — Other Ambulatory Visit: Payer: Self-pay | Admitting: Cardiovascular Disease

## 2022-02-14 ENCOUNTER — Other Ambulatory Visit: Payer: Self-pay | Admitting: *Deleted

## 2022-02-14 MED ORDER — CARVEDILOL 6.25 MG PO TABS: ORAL_TABLET | ORAL | 3 refills | Status: DC

## 2022-02-14 MED ORDER — CARVEDILOL 6.25 MG PO TABS: ORAL_TABLET | ORAL | 3 refills | Status: AC

## 2022-07-18 ENCOUNTER — Telehealth: Payer: Self-pay

## 2022-07-18 NOTE — Transitions of Care (Post Inpatient/ED Visit) (Unsigned)
   07/18/2022  Name: Tony Murphy MRN: [id number] DOB: 25-Jan-1971  Today's TOC FU Call Status: Today's TOC FU Call Status:: Unsuccessul Call (1st Attempt) Unsuccessful Call (1st Attempt) Date: 07/18/22  Attempted to reach the patient regarding the most recent Inpatient/ED visit.  Follow Up Plan: Additional outreach attempts will be made to reach the patient to complete the Transitions of Care (Post Inpatient/ED visit) call.   Signature Donnamarie Poag, CMA

## 2022-07-18 NOTE — Transitions of Care (Post Inpatient/ED Visit) (Deleted)
   07/18/2022  Name: Tony Murphy MRN: [id number] DOB: 15-Apr-1970  Today's TOC FU Call Status:    Attempted to reach the patient regarding the most recent Inpatient/ED visit.  Follow Up Plan: {TOCFUUNSUCCESSFUL:29072}  Signature ***

## 2022-07-19 NOTE — Transitions of Care (Post Inpatient/ED Visit) (Signed)
Unable to reach pt by phone an left v/m requesting pt to call [phone].      07/19/2022  Name: Tony Murphy MRN: [id number] DOB: 12-09-1970  Today's TOC FU Call Status: Today's TOC FU Call Status:: Unsuccessful Call (2nd Attempt) Unsuccessful Call (1st Attempt) Date: 07/18/22 Unsuccessful Call (2nd Attempt) Date: 07/19/22  Attempted to reach the patient regarding the most recent Inpatient/ED visit.  Follow Up Plan: Additional outreach attempts will be made to reach the patient to complete the Transitions of Care (Post Inpatient/ED visit) call.   Signature Lewanda Rife, LPN

## 2022-07-20 NOTE — Transitions of Care (Post Inpatient/ED Visit) (Signed)
Unable to reach pt by phone and left v/m for pt to call [phone].      07/20/2022  Name: Tony Murphy MRN: [id number] DOB: 04/15/1970  Today's TOC FU Call Status: Today's TOC FU Call Status:: Unsuccessful Call (3rd Attempt) Unsuccessful Call (1st Attempt) Date: 07/18/22 Unsuccessful Call (2nd Attempt) Date: 07/19/22 Unsuccessful Call (3rd Attempt) Date: 07/20/22  Attempted to reach the patient regarding the most recent Inpatient/ED visit.  Follow Up Plan: Additional outreach attempts will be made to reach the patient to complete the Transitions of Care (Post Inpatient/ED visit) call.   Signature Lewanda Rife, LPN

## 2022-07-26 NOTE — Telephone Encounter (Signed)
Patient contacted the office regarding a transition of care call from a few days ago. Relayed message that he was likely called due to a recent ED visit and the call was meant to check up on him. Patient states he has moved out of state and will be receiving follow up/ primary care in [place].

## 2022-07-27 NOTE — Telephone Encounter (Signed)
Noted  

## 2022-07-27 NOTE — Telephone Encounter (Signed)
FYI have removed you as PCP

## 2022-10-18 ENCOUNTER — Other Ambulatory Visit: Payer: Self-pay | Admitting: Nurse Practitioner

## 2022-10-18 DIAGNOSIS — R0982 Postnasal drip: Secondary | ICD-10-CM
